# Patient Record
Sex: Female | Born: 1950 | Race: White | Hispanic: No | Marital: Married | Smoking: Former smoker
Health system: Southern US, Community
[De-identification: ages and names within clinical notes are randomized; demographics above are authoritative.]

## PROBLEM LIST (undated history)

## (undated) DIAGNOSIS — N83202 Unspecified ovarian cyst, left side: Secondary | ICD-10-CM

## (undated) DIAGNOSIS — N879 Dysplasia of cervix uteri, unspecified: Secondary | ICD-10-CM

## (undated) DIAGNOSIS — Z9189 Other specified personal risk factors, not elsewhere classified: Secondary | ICD-10-CM

## (undated) DIAGNOSIS — N893 Dysplasia of vagina, unspecified: Secondary | ICD-10-CM

## (undated) HISTORY — DX: Dysplasia of vagina, unspecified: N89.3

## (undated) HISTORY — DX: Other specified personal risk factors, not elsewhere classified: Z91.89

## (undated) HISTORY — PX: OVARIAN CYST REMOVAL: SHX89

## (undated) HISTORY — DX: Dysplasia of cervix uteri, unspecified: N87.9

## (undated) HISTORY — PX: COLPOSCOPY: SHX161

## (undated) HISTORY — PX: OOPHORECTOMY: SHX86

## (undated) HISTORY — PX: BREAST BIOPSY: SHX20

## (undated) HISTORY — DX: Unspecified ovarian cyst, left side: N83.202

---

## 1990-10-12 DIAGNOSIS — N879 Dysplasia of cervix uteri, unspecified: Secondary | ICD-10-CM

## 1990-10-12 HISTORY — PX: CERVICAL BIOPSY  W/ LOOP ELECTRODE EXCISION: SUR135

## 1990-10-12 HISTORY — DX: Dysplasia of cervix uteri, unspecified: N87.9

## 1998-10-12 HISTORY — PX: APPENDECTOMY: SHX54

## 1998-10-12 HISTORY — PX: ABDOMINAL HYSTERECTOMY: SHX81

## 1998-12-04 ENCOUNTER — Inpatient Hospital Stay (HOSPITAL_COMMUNITY): Admission: RE | Admit: 1998-12-04 | Discharge: 1998-12-07 | Payer: Self-pay | Admitting: Obstetrics and Gynecology

## 1998-12-18 ENCOUNTER — Emergency Department (HOSPITAL_COMMUNITY): Admission: EM | Admit: 1998-12-18 | Discharge: 1998-12-19 | Payer: Self-pay | Admitting: Emergency Medicine

## 1999-09-19 ENCOUNTER — Emergency Department (HOSPITAL_COMMUNITY): Admission: EM | Admit: 1999-09-19 | Discharge: 1999-09-19 | Payer: Self-pay | Admitting: *Deleted

## 2000-01-28 ENCOUNTER — Other Ambulatory Visit: Admission: RE | Admit: 2000-01-28 | Discharge: 2000-01-28 | Payer: Self-pay | Admitting: Obstetrics and Gynecology

## 2001-01-31 ENCOUNTER — Other Ambulatory Visit: Admission: RE | Admit: 2001-01-31 | Discharge: 2001-01-31 | Payer: Self-pay | Admitting: Obstetrics and Gynecology

## 2002-01-23 ENCOUNTER — Other Ambulatory Visit: Admission: RE | Admit: 2002-01-23 | Discharge: 2002-01-23 | Payer: Self-pay | Admitting: Obstetrics and Gynecology

## 2003-03-15 ENCOUNTER — Other Ambulatory Visit: Admission: RE | Admit: 2003-03-15 | Discharge: 2003-03-15 | Payer: Self-pay | Admitting: Obstetrics and Gynecology

## 2003-10-13 DIAGNOSIS — N893 Dysplasia of vagina, unspecified: Secondary | ICD-10-CM

## 2003-10-13 HISTORY — DX: Dysplasia of vagina, unspecified: N89.3

## 2004-04-02 ENCOUNTER — Other Ambulatory Visit: Admission: RE | Admit: 2004-04-02 | Discharge: 2004-04-02 | Payer: Self-pay | Admitting: Obstetrics and Gynecology

## 2004-05-13 ENCOUNTER — Other Ambulatory Visit: Admission: RE | Admit: 2004-05-13 | Discharge: 2004-05-13 | Payer: Self-pay | Admitting: Obstetrics and Gynecology

## 2004-11-06 ENCOUNTER — Other Ambulatory Visit: Admission: RE | Admit: 2004-11-06 | Discharge: 2004-11-06 | Payer: Self-pay | Admitting: Obstetrics and Gynecology

## 2005-04-30 ENCOUNTER — Other Ambulatory Visit: Admission: RE | Admit: 2005-04-30 | Discharge: 2005-04-30 | Payer: Self-pay | Admitting: Obstetrics and Gynecology

## 2006-01-18 ENCOUNTER — Other Ambulatory Visit: Admission: RE | Admit: 2006-01-18 | Discharge: 2006-01-18 | Payer: Self-pay | Admitting: Obstetrics and Gynecology

## 2006-05-26 ENCOUNTER — Other Ambulatory Visit: Admission: RE | Admit: 2006-05-26 | Discharge: 2006-05-26 | Payer: Self-pay | Admitting: Obstetrics and Gynecology

## 2007-06-06 ENCOUNTER — Other Ambulatory Visit: Admission: RE | Admit: 2007-06-06 | Discharge: 2007-06-06 | Payer: Self-pay | Admitting: Obstetrics and Gynecology

## 2008-06-06 ENCOUNTER — Other Ambulatory Visit: Admission: RE | Admit: 2008-06-06 | Discharge: 2008-06-06 | Payer: Self-pay | Admitting: Obstetrics and Gynecology

## 2008-06-26 ENCOUNTER — Ambulatory Visit: Payer: Self-pay | Admitting: Obstetrics and Gynecology

## 2008-10-08 ENCOUNTER — Ambulatory Visit: Payer: Self-pay | Admitting: Obstetrics and Gynecology

## 2008-10-12 HISTORY — PX: BREAST SURGERY: SHX581

## 2009-06-25 ENCOUNTER — Other Ambulatory Visit: Admission: RE | Admit: 2009-06-25 | Discharge: 2009-06-25 | Payer: Self-pay | Admitting: Obstetrics and Gynecology

## 2009-06-25 ENCOUNTER — Encounter: Payer: Self-pay | Admitting: Obstetrics and Gynecology

## 2009-06-25 ENCOUNTER — Ambulatory Visit: Payer: Self-pay | Admitting: Obstetrics and Gynecology

## 2010-06-10 ENCOUNTER — Encounter: Admission: RE | Admit: 2010-06-10 | Discharge: 2010-06-10 | Payer: Self-pay | Admitting: Obstetrics and Gynecology

## 2010-07-10 ENCOUNTER — Ambulatory Visit: Payer: Self-pay | Admitting: Obstetrics and Gynecology

## 2010-07-10 ENCOUNTER — Other Ambulatory Visit: Admission: RE | Admit: 2010-07-10 | Discharge: 2010-07-10 | Payer: Self-pay | Admitting: Obstetrics and Gynecology

## 2010-07-25 ENCOUNTER — Ambulatory Visit: Payer: Self-pay | Admitting: Obstetrics and Gynecology

## 2011-05-06 ENCOUNTER — Other Ambulatory Visit: Payer: Self-pay | Admitting: Obstetrics and Gynecology

## 2011-05-06 DIAGNOSIS — Z1231 Encounter for screening mammogram for malignant neoplasm of breast: Secondary | ICD-10-CM

## 2011-06-12 ENCOUNTER — Ambulatory Visit
Admission: RE | Admit: 2011-06-12 | Discharge: 2011-06-12 | Disposition: A | Discharge status: Home / Self Care | Payer: BC Managed Care – PPO | Source: Ambulatory Visit | Attending: Obstetrics and Gynecology | Admitting: Obstetrics and Gynecology

## 2011-06-12 DIAGNOSIS — Z1231 Encounter for screening mammogram for malignant neoplasm of breast: Secondary | ICD-10-CM

## 2011-06-26 DIAGNOSIS — N893 Dysplasia of vagina, unspecified: Secondary | ICD-10-CM | POA: Insufficient documentation

## 2011-07-14 ENCOUNTER — Ambulatory Visit (INDEPENDENT_AMBULATORY_CARE_PROVIDER_SITE_OTHER): Payer: BC Managed Care – PPO | Admitting: Obstetrics and Gynecology

## 2011-07-14 ENCOUNTER — Other Ambulatory Visit (HOSPITAL_COMMUNITY)
Admission: RE | Admit: 2011-07-14 | Discharge: 2011-07-14 | Disposition: A | Discharge status: Home / Self Care | Payer: BC Managed Care – PPO | Source: Ambulatory Visit | Attending: Obstetrics and Gynecology | Admitting: Obstetrics and Gynecology

## 2011-07-14 ENCOUNTER — Encounter: Payer: Self-pay | Admitting: Obstetrics and Gynecology

## 2011-07-14 VITALS — BP 120/70 | Ht 65.0 in | Wt 137.0 lb

## 2011-07-14 DIAGNOSIS — Z01419 Encounter for gynecological examination (general) (routine) without abnormal findings: Secondary | ICD-10-CM

## 2011-07-14 DIAGNOSIS — Z780 Asymptomatic menopausal state: Secondary | ICD-10-CM

## 2011-07-14 DIAGNOSIS — M810 Age-related osteoporosis without current pathological fracture: Secondary | ICD-10-CM

## 2011-07-14 DIAGNOSIS — N951 Menopausal and female climacteric states: Secondary | ICD-10-CM

## 2011-07-14 DIAGNOSIS — E7800 Pure hypercholesterolemia, unspecified: Secondary | ICD-10-CM

## 2011-07-14 MED ORDER — IBANDRONATE SODIUM 150 MG PO TABS: 150.0000 mg | ORAL_TABLET | ORAL | Status: DC

## 2011-07-14 MED ORDER — ESTROGENS CONJUGATED 0.625 MG PO TABS: 0.6250 mg | ORAL_TABLET | Freq: Every day | ORAL | Status: DC

## 2011-07-14 NOTE — Progress Notes (Signed)
The patient came to see me today for her annual GYN exam. She is doing well on her Premarin. She is on Boniva for osteoporosis with good results. She's had no fractures. She is overdue for colonoscopy.  HEENT: Within normal limits. Neck: No masses. Supraclavicular lymph nodes: Not enlarged. Breasts: Examined in both sitting and lying position. Symmetrical without skin changes or masses. Abdomen: Soft no masses guarding or rebound. No hernias. Pelvic: External within normal limits. BUS within normal limits. Vaginal examination shows good estrogen effect, no cystocele enterocele or rectocele. Cervix and uterus absent. Adnexa within normal limits. Rectovaginal confirmatory. Extremities within normal limits.   Assessment: #1. Menopausal symptoms #2. Osteoporosis  Plan: Continue Boniva. Discussed and estrogen patch but patient declined. Continue Premarin. Patient is scheduled colonoscopy.

## 2011-08-05 ENCOUNTER — Other Ambulatory Visit: Payer: Self-pay | Admitting: Obstetrics and Gynecology

## 2011-08-05 NOTE — Telephone Encounter (Signed)
rx phoned in

## 2011-08-13 ENCOUNTER — Telehealth: Payer: Self-pay

## 2011-08-13 NOTE — Telephone Encounter (Signed)
Left message and told patient what Dr. Reece Agar said in note below. Call me if any questions.

## 2011-08-13 NOTE — Telephone Encounter (Signed)
Macrocytosis means her red blood cells slightly larger than average. She was not however anemic and so I do not believe it's a sign of any disease. It would not hurt however for her run this by her PCP.

## 2011-08-13 NOTE — Telephone Encounter (Signed)
She said she got copy of her labs in mail and letter saying cholesterol okay. She was filing that recently and noticed at the bottom of the CBC w Diff that it said Flags:  Macrocytosis.  She said that was not addressed. She said she researched it on the internet and it said it can be a sign of Bone Marrow problems, liver disease, Vit B12 deficiency. She said she feels fine but wondered if this was anything significant??  Also, wanted Dr. Reece Agar to know she is having colonoscopy next Tuesday.

## 2011-08-21 ENCOUNTER — Encounter: Payer: Self-pay | Admitting: Obstetrics and Gynecology

## 2012-06-01 ENCOUNTER — Other Ambulatory Visit: Payer: Self-pay | Admitting: Obstetrics and Gynecology

## 2012-06-01 ENCOUNTER — Telehealth: Payer: Self-pay | Admitting: *Deleted

## 2012-06-01 DIAGNOSIS — Z1231 Encounter for screening mammogram for malignant neoplasm of breast: Secondary | ICD-10-CM

## 2012-06-01 NOTE — Telephone Encounter (Signed)
Pt called requesting date of last dexa 07/10/10 giving to pt and told to call and schedule anytime after this date.

## 2012-06-15 ENCOUNTER — Ambulatory Visit
Admission: RE | Admit: 2012-06-15 | Discharge: 2012-06-15 | Disposition: A | Discharge status: Home / Self Care | Payer: BC Managed Care – PPO | Source: Ambulatory Visit | Attending: Obstetrics and Gynecology | Admitting: Obstetrics and Gynecology

## 2012-06-15 DIAGNOSIS — Z1231 Encounter for screening mammogram for malignant neoplasm of breast: Secondary | ICD-10-CM

## 2012-07-13 ENCOUNTER — Other Ambulatory Visit: Payer: Self-pay | Admitting: Obstetrics and Gynecology

## 2012-07-13 DIAGNOSIS — M810 Age-related osteoporosis without current pathological fracture: Secondary | ICD-10-CM

## 2012-07-18 ENCOUNTER — Encounter: Payer: BC Managed Care – PPO | Admitting: Obstetrics and Gynecology

## 2012-07-19 ENCOUNTER — Ambulatory Visit (INDEPENDENT_AMBULATORY_CARE_PROVIDER_SITE_OTHER): Payer: BC Managed Care – PPO | Admitting: Obstetrics and Gynecology

## 2012-07-19 ENCOUNTER — Encounter: Payer: Self-pay | Admitting: Obstetrics and Gynecology

## 2012-07-19 ENCOUNTER — Ambulatory Visit (INDEPENDENT_AMBULATORY_CARE_PROVIDER_SITE_OTHER): Payer: BC Managed Care – PPO

## 2012-07-19 ENCOUNTER — Other Ambulatory Visit (HOSPITAL_COMMUNITY)
Admission: RE | Admit: 2012-07-19 | Discharge: 2012-07-19 | Disposition: A | Discharge status: Home / Self Care | Payer: BC Managed Care – PPO | Source: Ambulatory Visit | Attending: Obstetrics and Gynecology | Admitting: Obstetrics and Gynecology

## 2012-07-19 VITALS — BP 120/70 | Ht 65.0 in | Wt 132.0 lb

## 2012-07-19 DIAGNOSIS — Z01419 Encounter for gynecological examination (general) (routine) without abnormal findings: Secondary | ICD-10-CM

## 2012-07-19 DIAGNOSIS — M810 Age-related osteoporosis without current pathological fracture: Secondary | ICD-10-CM

## 2012-07-19 DIAGNOSIS — Z780 Asymptomatic menopausal state: Secondary | ICD-10-CM

## 2012-07-19 DIAGNOSIS — Z1151 Encounter for screening for human papillomavirus (HPV): Secondary | ICD-10-CM | POA: Insufficient documentation

## 2012-07-19 LAB — CBC WITH DIFFERENTIAL/PLATELET
Basophils Absolute: 0 10*3/uL (ref 0.0–0.1)
Eosinophils Relative: 1 % (ref 0–5)
HCT: 39.8 % (ref 36.0–46.0)
Lymphocytes Relative: 33 % (ref 12–46)
Lymphs Abs: 1.8 10*3/uL (ref 0.7–4.0)
MCV: 102.3 fL — ABNORMAL HIGH (ref 78.0–100.0)
Monocytes Absolute: 0.5 10*3/uL (ref 0.1–1.0)
Neutro Abs: 3.1 10*3/uL (ref 1.7–7.7)
RBC: 3.89 MIL/uL (ref 3.87–5.11)
WBC: 5.5 10*3/uL (ref 4.0–10.5)

## 2012-07-19 LAB — HEMOGLOBIN A1C: Mean Plasma Glucose: 105 mg/dL (ref <117)

## 2012-07-19 LAB — LIPID PANEL
Cholesterol: 212 mg/dL — ABNORMAL HIGH (ref 0–200)
Triglycerides: 101 mg/dL (ref <150)

## 2012-07-19 MED ORDER — ESTROGENS CONJUGATED 0.625 MG PO TABS: 0.6250 mg | ORAL_TABLET | Freq: Every day | ORAL | Status: DC

## 2012-07-19 MED ORDER — IBANDRONATE SODIUM 150 MG PO TABS: 150.0000 mg | ORAL_TABLET | ORAL | Status: DC

## 2012-07-19 NOTE — Addendum Note (Signed)
Addended by: Dayna Barker on: 07/19/2012 10:03 AM   Modules accepted: Orders

## 2012-07-19 NOTE — Patient Instructions (Signed)
Continue Premarin and Boniva.

## 2012-07-19 NOTE — Progress Notes (Signed)
Patient came to see me today for her annual GYN exam. She remains on Premarin. We had discussed previously switching her to estrogen patch but she declined. She is aware of the differences in DVT risk. She takes her Premarin 3 weeks on and one week off. During her three-week she's on she also takes breaks in  Treatment. She had her bone density today. She is on generic Boniva for her osteoporosis. Her hip showed statistically significant improvement but there was some decline in her spine. She has been on medication for 4 years. She has had no fractures. She had a TAH, BSO, appendectomy in 2000 for fibroids and a benign ovarian cyst. She had had a LEEP procedure for cervical dysplasia in 1992. Her pathology report at the time of hysterectomy showed a benign cervix. 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 Starting  in 2006 she has had normal Pap smears yearly. She has had a colonoscopy in the last year with removal of benign polyp. She had a normal mammogram this year. She has had no vaginal bleeding or pelvic pain.  HEENT: Within normal limits. Kennon Portela present. Neck: No masses. Supraclavicular lymph nodes: Not enlarged. Breasts: Examined in both sitting and lying position. Symmetrical without skin changes or masses. Abdomen: Soft no masses guarding or rebound. No hernias. Pelvic: External within normal limits. BUS within normal limits. Vaginal examination shows good estrogen effect, no cystocele enterocele or rectocele. Cervix and uterus absent. Adnexa within normal limits. Rectovaginal confirmatory. Extremities within normal limits.  Assessment: #1. Osteoporosis #2. Menopausal symptoms #3. Low grade vaginal dysplasia  Plan: Continue Premarin as above. Continue generic Boniva as above. We discussed her mixed findings on bone density. We discussed followup bone density in one year to be sure the spine loss is not a trend. The new Pap smear guidelines were discussed with the  patient. Pap and high risk HPV testing done.

## 2012-07-20 ENCOUNTER — Other Ambulatory Visit: Payer: Self-pay | Admitting: Obstetrics and Gynecology

## 2012-07-20 DIAGNOSIS — E7800 Pure hypercholesterolemia, unspecified: Secondary | ICD-10-CM

## 2012-07-20 LAB — URINALYSIS W MICROSCOPIC + REFLEX CULTURE
Bilirubin Urine: NEGATIVE
Casts: NONE SEEN
Glucose, UA: NEGATIVE mg/dL
Hgb urine dipstick: NEGATIVE
Ketones, ur: NEGATIVE mg/dL
Protein, ur: NEGATIVE mg/dL

## 2012-07-22 LAB — URINE CULTURE

## 2012-09-10 ENCOUNTER — Other Ambulatory Visit: Payer: Self-pay | Admitting: Obstetrics and Gynecology

## 2012-09-12 NOTE — Telephone Encounter (Signed)
rx called in [facility] 

## 2012-09-13 ENCOUNTER — Telehealth: Payer: Self-pay | Admitting: *Deleted

## 2012-09-13 ENCOUNTER — Other Ambulatory Visit: Payer: Self-pay | Admitting: Obstetrics and Gynecology

## 2012-09-13 NOTE — Telephone Encounter (Signed)
Pt bonvia never was received at the pharmacy rx called in to [facility]. Left on pt voicemail this has been done.

## 2013-05-25 ENCOUNTER — Telehealth: Payer: Self-pay | Admitting: *Deleted

## 2013-05-25 NOTE — Telephone Encounter (Signed)
Pt called left message in voicemail if 3D mammogram should be done. I called pt back and left message this decision if total up to pt if she would like to have done. Pt is aware of the extra cost for this 3D mammogram.

## 2013-05-26 ENCOUNTER — Other Ambulatory Visit: Payer: Self-pay

## 2013-05-26 DIAGNOSIS — Z1231 Encounter for screening mammogram for malignant neoplasm of breast: Secondary | ICD-10-CM

## 2013-06-14 ENCOUNTER — Other Ambulatory Visit: Payer: Self-pay | Admitting: Gynecology

## 2013-06-14 DIAGNOSIS — M810 Age-related osteoporosis without current pathological fracture: Secondary | ICD-10-CM

## 2013-06-16 ENCOUNTER — Ambulatory Visit
Admission: RE | Admit: 2013-06-16 | Discharge: 2013-06-16 | Disposition: A | Discharge status: Home / Self Care | Payer: BC Managed Care – PPO | Source: Ambulatory Visit

## 2013-06-16 DIAGNOSIS — Z1231 Encounter for screening mammogram for malignant neoplasm of breast: Secondary | ICD-10-CM

## 2013-06-19 ENCOUNTER — Other Ambulatory Visit: Payer: Self-pay | Admitting: Gynecology

## 2013-06-19 DIAGNOSIS — R928 Other abnormal and inconclusive findings on diagnostic imaging of breast: Secondary | ICD-10-CM

## 2013-06-21 ENCOUNTER — Ambulatory Visit
Admission: RE | Admit: 2013-06-21 | Discharge: 2013-06-21 | Disposition: A | Discharge status: Home / Self Care | Payer: BC Managed Care – PPO | Source: Ambulatory Visit | Attending: Gynecology | Admitting: Gynecology

## 2013-06-21 DIAGNOSIS — R928 Other abnormal and inconclusive findings on diagnostic imaging of breast: Secondary | ICD-10-CM

## 2013-06-27 ENCOUNTER — Other Ambulatory Visit: Payer: BC Managed Care – PPO

## 2013-07-27 ENCOUNTER — Encounter: Payer: Self-pay | Admitting: Gynecology

## 2013-08-03 ENCOUNTER — Ambulatory Visit (INDEPENDENT_AMBULATORY_CARE_PROVIDER_SITE_OTHER): Payer: BC Managed Care – PPO

## 2013-08-03 ENCOUNTER — Ambulatory Visit (INDEPENDENT_AMBULATORY_CARE_PROVIDER_SITE_OTHER): Payer: BC Managed Care – PPO | Admitting: Gynecology

## 2013-08-03 ENCOUNTER — Encounter: Payer: Self-pay | Admitting: Gynecology

## 2013-08-03 VITALS — BP 124/78 | Ht 65.0 in | Wt 140.0 lb

## 2013-08-03 DIAGNOSIS — M810 Age-related osteoporosis without current pathological fracture: Secondary | ICD-10-CM

## 2013-08-03 DIAGNOSIS — N893 Dysplasia of vagina, unspecified: Secondary | ICD-10-CM

## 2013-08-03 DIAGNOSIS — Z01419 Encounter for gynecological examination (general) (routine) without abnormal findings: Secondary | ICD-10-CM

## 2013-08-03 DIAGNOSIS — N951 Menopausal and female climacteric states: Secondary | ICD-10-CM

## 2013-08-03 DIAGNOSIS — N898 Other specified noninflammatory disorders of vagina: Secondary | ICD-10-CM

## 2013-08-03 DIAGNOSIS — Z780 Asymptomatic menopausal state: Secondary | ICD-10-CM

## 2013-08-03 LAB — LIPID PANEL
Cholesterol: 210 mg/dL — ABNORMAL HIGH (ref 0–200)
HDL: 83 mg/dL (ref >39)
LDL Cholesterol: 113 mg/dL — ABNORMAL HIGH (ref 0–99)
Triglycerides: 69 mg/dL (ref <150)
VLDL: 14 mg/dL (ref 0–40)

## 2013-08-03 LAB — CBC WITH DIFFERENTIAL/PLATELET
Basophils Relative: 0 % (ref 0–1)
Eosinophils Absolute: 0.1 10*3/uL (ref 0.0–0.7)
HCT: 38.6 % (ref 36.0–46.0)
Hemoglobin: 13.4 g/dL (ref 12.0–15.0)
MCH: 33.9 pg (ref 26.0–34.0)
MCHC: 34.7 g/dL (ref 30.0–36.0)
MCV: 97.7 fL (ref 78.0–100.0)
Monocytes Absolute: 0.4 10*3/uL (ref 0.1–1.0)
Monocytes Relative: 7 % (ref 3–12)

## 2013-08-03 LAB — COMPREHENSIVE METABOLIC PANEL
Albumin: 4.1 g/dL (ref 3.5–5.2)
Alkaline Phosphatase: 59 U/L (ref 39–117)
BUN: 16 mg/dL (ref 6–23)
CO2: 29 mEq/L (ref 19–32)
Glucose, Bld: 91 mg/dL (ref 70–99)
Total Bilirubin: 0.5 mg/dL (ref 0.3–1.2)

## 2013-08-03 MED ORDER — ESTROGENS CONJUGATED 0.625 MG PO TABS: 0.6250 mg | ORAL_TABLET | Freq: Every day | ORAL | Status: DC

## 2013-08-03 NOTE — Progress Notes (Signed)
Wendy Rangel Jun 22, 1951 [id number]        62 y.o.  G1P0010 for annual exam.  Former patient Dr. Eda Paschal. Several issues noted below.  Past medical history,surgical history, medications, allergies, family history and social history were all reviewed and documented in the EPIC chart.  ROS:  Performed and pertinent positives and negatives are included in the history, assessment and plan .  Exam: Kim assistant Filed Vitals:   08/03/13 1012  BP: 124/78  Height: 5\' 5"  (1.651 m)  Weight: 140 lb (63.504 kg)   General appearance  Normal Skin grossly normal Head/Neck normal with no cervical or supraclavicular adenopathy thyroid normal Lungs  clear Cardiac RR, without RMG Abdominal  soft, nontender, without masses, organomegaly or hernia Breasts  examined lying and sitting without masses, retractions, discharge or axillary adenopathy. Pelvic  Ext/BUS/vagina  normal with mild atrophic changes  Adnexa  Without masses or tenderness    Anus and perineum  normal   Rectovaginal  normal sphincter tone without palpated masses or tenderness.    Assessment/Plan:  62 y.o. G28P0010 female for annual exam.   1. 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 On ERT Premarin 0.625 although admits to not taking it regularly.  I reviewed the whole issue of HRT with her to include the WHI study with increased risk of stroke, heart attack, DVT and breast cancer. The ACOG and NAMS statements for lowest dose for the shortest period of time reviewed. Transdermal versus oral first-pass effect benefit discussed.  As she's not taking it regularly the options to wean and stop now discussed. Patient is not interested in doing that at this point and wants to continue. She does note when she goes without it she does have hot flashes and that's why she restarts it. She understands the risks and accepts them. I refilled her x1 year. 2. Osteoporosis. Patient had DEXA today at 1 year short interval followup with T score  -2.5 left femoral neck. Spine with statistically significant improvement. Right and left hips stable. Patient has been on Boniva for 5 years. Options for drug-free holiday now versus continuing for 2 more years with repeat DEXA discussed. Patient strongly wants a free holiday and we will plan on stopping her Boniva and repeating her DEXA in 2 years. Increase calcium vitamin D reviewed. Check vitamin D level today. 3. Mammography 06/2013. Continue with annual mammography. SBE monthly reviewed. 4. Pap smear 2013. No Pap smear done today. History of VAIN 1 2005 with normal Pap smears since then to include Pap/HPV negative last year. Options to stop screening altogether versus less frequent screening intervals reviewed. She is status post hysterectomy for benign indications. We'll plan repeat Pap smear in 3 year interval. 5. Colonoscopy 2012. Repeat at their recommended interval. 6. Health maintenance. Baseline CBC comprehensive metabolic panel lipid profile urinalysis TSH vitamin D ordered. Followup one year, sooner as needed.    Note: This document was prepared with digital dictation and possible smart phrase technology. Any transcriptional errors that result from this process are unintentional.   Dara Lords MD, 10:50 AM 08/03/2013

## 2013-08-03 NOTE — Patient Instructions (Signed)
Followup in one year for annual exam, sooner as needed. 

## 2013-08-04 LAB — URINALYSIS W MICROSCOPIC + REFLEX CULTURE
Bacteria, UA: NONE SEEN
Bilirubin Urine: NEGATIVE
Crystals: NONE SEEN
Glucose, UA: NEGATIVE mg/dL
Ketones, ur: NEGATIVE mg/dL
Specific Gravity, Urine: 1.021 (ref 1.005–1.030)
Squamous Epithelial / LPF: NONE SEEN
Urobilinogen, UA: 0.2 mg/dL (ref 0.0–1.0)

## 2013-08-17 ENCOUNTER — Other Ambulatory Visit: Payer: Self-pay

## 2014-05-18 ENCOUNTER — Other Ambulatory Visit: Payer: Self-pay

## 2014-05-18 DIAGNOSIS — Z1231 Encounter for screening mammogram for malignant neoplasm of breast: Secondary | ICD-10-CM

## 2014-06-22 ENCOUNTER — Ambulatory Visit: Payer: BC Managed Care – PPO

## 2014-06-29 ENCOUNTER — Ambulatory Visit
Admission: RE | Admit: 2014-06-29 | Discharge: 2014-06-29 | Disposition: A | Discharge status: Home / Self Care | Payer: BC Managed Care – PPO | Source: Ambulatory Visit

## 2014-06-29 DIAGNOSIS — Z1231 Encounter for screening mammogram for malignant neoplasm of breast: Secondary | ICD-10-CM

## 2014-08-09 ENCOUNTER — Encounter: Payer: Self-pay | Admitting: Gynecology

## 2014-08-09 ENCOUNTER — Ambulatory Visit (INDEPENDENT_AMBULATORY_CARE_PROVIDER_SITE_OTHER): Payer: BC Managed Care – PPO | Admitting: Gynecology

## 2014-08-09 VITALS — BP 120/70 | Ht 65.0 in | Wt 143.0 lb

## 2014-08-09 DIAGNOSIS — M810 Age-related osteoporosis without current pathological fracture: Secondary | ICD-10-CM

## 2014-08-09 DIAGNOSIS — Z01419 Encounter for gynecological examination (general) (routine) without abnormal findings: Secondary | ICD-10-CM

## 2014-08-09 DIAGNOSIS — Z79890 Hormone replacement therapy (postmenopausal): Secondary | ICD-10-CM

## 2014-08-09 DIAGNOSIS — Z780 Asymptomatic menopausal state: Secondary | ICD-10-CM

## 2014-08-09 LAB — CBC WITH DIFFERENTIAL/PLATELET
Basophils Absolute: 0 10*3/uL (ref 0.0–0.1)
Basophils Relative: 0 % (ref 0–1)
Eosinophils Absolute: 0.1 10*3/uL (ref 0.0–0.7)
Eosinophils Relative: 2 % (ref 0–5)
HEMATOCRIT: 38.5 % (ref 36.0–46.0)
HEMOGLOBIN: 13.3 g/dL (ref 12.0–15.0)
LYMPHS PCT: 36 % (ref 12–46)
Lymphs Abs: 1.9 10*3/uL (ref 0.7–4.0)
MCH: 34 pg (ref 26.0–34.0)
MCHC: 34.5 g/dL (ref 30.0–36.0)
MCV: 98.5 fL (ref 78.0–100.0)
MONO ABS: 0.4 10*3/uL (ref 0.1–1.0)
MONOS PCT: 8 % (ref 3–12)
NEUTROS ABS: 2.8 10*3/uL (ref 1.7–7.7)
Neutrophils Relative %: 54 % (ref 43–77)
Platelets: 329 10*3/uL (ref 150–400)
RBC: 3.91 MIL/uL (ref 3.87–5.11)
RDW: 13 % (ref 11.5–15.5)
WBC: 5.2 10*3/uL (ref 4.0–10.5)

## 2014-08-09 LAB — COMPREHENSIVE METABOLIC PANEL
ALBUMIN: 3.9 g/dL (ref 3.5–5.2)
ALT: 13 U/L (ref 0–35)
AST: 20 U/L (ref 0–37)
Alkaline Phosphatase: 65 U/L (ref 39–117)
BUN: 17 mg/dL (ref 6–23)
CALCIUM: 8.7 mg/dL (ref 8.4–10.5)
CHLORIDE: 103 meq/L (ref 96–112)
CO2: 29 mEq/L (ref 19–32)
Creat: 0.65 mg/dL (ref 0.50–1.10)
GLUCOSE: 87 mg/dL (ref 70–99)
POTASSIUM: 4.1 meq/L (ref 3.5–5.3)
Sodium: 136 mEq/L (ref 135–145)
Total Bilirubin: 1 mg/dL (ref 0.2–1.2)
Total Protein: 6.7 g/dL (ref 6.0–8.3)

## 2014-08-09 LAB — LIPID PANEL
Cholesterol: 209 mg/dL — ABNORMAL HIGH (ref 0–200)
HDL: 70 mg/dL (ref >39)
LDL Cholesterol: 118 mg/dL — ABNORMAL HIGH (ref 0–99)
TRIGLYCERIDES: 106 mg/dL (ref <150)
Total CHOL/HDL Ratio: 3 Ratio
VLDL: 21 mg/dL (ref 0–40)

## 2014-08-09 LAB — TSH: TSH: 3.033 u[IU]/mL (ref 0.350–4.500)

## 2014-08-09 MED ORDER — ESTROGENS CONJUGATED 0.625 MG PO TABS: 0.6250 mg | ORAL_TABLET | Freq: Every day | ORAL | Status: DC

## 2014-08-09 NOTE — Patient Instructions (Signed)
You may obtain a copy of any labs that were done today by logging onto MyChart as outlined in the instructions provided with your AVS (after visit summary). The office will not call with normal lab results but certainly if there are any significant abnormalities then we will contact you.   Health Maintenance, Female A healthy lifestyle and preventative care can promote health and wellness.  Maintain regular health, dental, and eye exams.  Eat a healthy diet. Foods like vegetables, fruits, whole grains, low-fat dairy products, and lean protein foods contain the nutrients you need without too many calories. Decrease your intake of foods high in solid fats, added sugars, and salt. Get information about a proper diet from your caregiver, if necessary.  Regular physical exercise is one of the most important things you can do for your health. Most adults should get at least 150 minutes of moderate-intensity exercise (any activity that increases your heart rate and causes you to sweat) each week. In addition, most adults need muscle-strengthening exercises on 2 or more days a week.   Maintain a healthy weight. The body mass index (BMI) is a screening tool to identify possible weight problems. It provides an estimate of body fat based on height and weight. Your caregiver can help determine your BMI, and can help you achieve or maintain a healthy weight. For adults 20 years and older:  A BMI below 18.5 is considered underweight.  A BMI of 18.5 to 24.9 is normal.  A BMI of 25 to 29.9 is considered overweight.  A BMI of 30 and above is considered obese.  Maintain normal blood lipids and cholesterol by exercising and minimizing your intake of saturated fat. Eat a balanced diet with plenty of fruits and vegetables. Blood tests for lipids and cholesterol should begin at age 61 and be repeated every 5 years. If your lipid or cholesterol levels are high, you are over 50, or you are a high risk for heart  disease, you may need your cholesterol levels checked more frequently.Ongoing high lipid and cholesterol levels should be treated with medicines if diet and exercise are not effective.  If you smoke, find out from your caregiver how to quit. If you do not use tobacco, do not start.  Lung cancer screening is recommended for adults aged 33 80 years who are at high risk for developing lung cancer because of a history of smoking. Yearly low-dose computed tomography (CT) is recommended for people who have at least a 30-pack-year history of smoking and are a current smoker or have quit within the past 15 years. A pack year of smoking is smoking an average of 1 pack of cigarettes a day for 1 year (for example: 1 pack a day for 30 years or 2 packs a day for 15 years). Yearly screening should continue until the smoker has stopped smoking for at least 15 years. Yearly screening should also be stopped for people who develop a health problem that would prevent them from having lung cancer treatment.  If you are pregnant, do not drink alcohol. If you are breastfeeding, be very cautious about drinking alcohol. If you are not pregnant and choose to drink alcohol, do not exceed 1 drink per day. One drink is considered to be 12 ounces (355 mL) of beer, 5 ounces (148 mL) of wine, or 1.5 ounces (44 mL) of liquor.  Avoid use of street drugs. Do not share needles with anyone. Ask for help if you need support or instructions about stopping  the use of drugs.  High blood pressure causes heart disease and increases the risk of stroke. Blood pressure should be checked at least every 1 to 2 years. Ongoing high blood pressure should be treated with medicines, if weight loss and exercise are not effective.  If you are 59 to 62 years old, ask your caregiver if you should take aspirin to prevent strokes.  Diabetes screening involves taking a blood sample to check your fasting blood sugar level. This should be done once every 3  years, after age 91, if you are within normal weight and without risk factors for diabetes. Testing should be considered at a younger age or be carried out more frequently if you are overweight and have at least 1 risk factor for diabetes.  Breast cancer screening is essential preventative care for women. You should practice "breast self-awareness." This means understanding the normal appearance and feel of your breasts and may include breast self-examination. Any changes detected, no matter how small, should be reported to a caregiver. Women in their 66s and 30s should have a clinical breast exam (CBE) by a caregiver as part of a regular health exam every 1 to 3 years. After age 101, women should have a CBE every year. Starting at age 100, women should consider having a mammogram (breast X-ray) every year. Women who have a family history of breast cancer should talk to their caregiver about genetic screening. Women at a high risk of breast cancer should talk to their caregiver about having an MRI and a mammogram every year.  Breast cancer gene (BRCA)-related cancer risk assessment is recommended for women who have family members with BRCA-related cancers. BRCA-related cancers include breast, ovarian, tubal, and peritoneal cancers. Having family members with these cancers may be associated with an increased risk for harmful changes (mutations) in the breast cancer genes BRCA1 and BRCA2. Results of the assessment will determine the need for genetic counseling and BRCA1 and BRCA2 testing.  The Pap test is a screening test for cervical cancer. Women should have a Pap test starting at age 57. Between ages 25 and 35, Pap tests should be repeated every 2 years. Beginning at age 37, you should have a Pap test every 3 years as long as the past 3 Pap tests have been normal. If you had a hysterectomy for a problem that was not cancer or a condition that could lead to cancer, then you no longer need Pap tests. If you are  between ages 50 and 76, and you have had normal Pap tests going back 10 years, you no longer need Pap tests. If you have had past treatment for cervical cancer or a condition that could lead to cancer, you need Pap tests and screening for cancer for at least 20 years after your treatment. If Pap tests have been discontinued, risk factors (such as a new sexual partner) need to be reassessed to determine if screening should be resumed. Some women have medical problems that increase the chance of getting cervical cancer. In these cases, your caregiver may recommend more frequent screening and Pap tests.  The human papillomavirus (HPV) test is an additional test that may be used for cervical cancer screening. The HPV test looks for the virus that can cause the cell changes on the cervix. The cells collected during the Pap test can be tested for HPV. The HPV test could be used to screen women aged 44 years and older, and should be used in women of any age  who have unclear Pap test results. After the age of 55, women should have HPV testing at the same frequency as a Pap test.  Colorectal cancer can be detected and often prevented. Most routine colorectal cancer screening begins at the age of 44 and continues through age 20. However, your caregiver may recommend screening at an earlier age if you have risk factors for colon cancer. On a yearly basis, your caregiver may provide home test kits to check for hidden blood in the stool. Use of a small camera at the end of a tube, to directly examine the colon (sigmoidoscopy or colonoscopy), can detect the earliest forms of colorectal cancer. Talk to your caregiver about this at age 86, when routine screening begins. Direct examination of the colon should be repeated every 5 to 10 years through age 13, unless early forms of pre-cancerous polyps or small growths are found.  Hepatitis C blood testing is recommended for all people born from [birth year] through 1964 and any  individual with known risks for hepatitis C.  Practice safe sex. Use condoms and avoid high-risk sexual practices to reduce the spread of sexually transmitted infections (STIs). Sexually active women aged 36 and younger should be checked for Chlamydia, which is a common sexually transmitted infection. Older women with new or multiple partners should also be tested for Chlamydia. Testing for other STIs is recommended if you are sexually active and at increased risk.  Osteoporosis is a disease in which the bones lose minerals and strength with aging. This can result in serious bone fractures. The risk of osteoporosis can be identified using a bone density scan. Women ages 20 and over and women at risk for fractures or osteoporosis should discuss screening with their caregivers. Ask your caregiver whether you should be taking a calcium supplement or vitamin D to reduce the rate of osteoporosis.  Menopause can be associated with physical symptoms and risks. Hormone replacement therapy is available to decrease symptoms and risks. You should talk to your caregiver about whether hormone replacement therapy is right for you.  Use sunscreen. Apply sunscreen liberally and repeatedly throughout the day. You should seek shade when your shadow is shorter than you. Protect yourself by wearing long sleeves, pants, a wide-brimmed hat, and sunglasses year round, whenever you are outdoors.  Notify your caregiver of new moles or changes in moles, especially if there is a change in shape or color. Also notify your caregiver if a mole is larger than the size of a pencil eraser.  Stay current with your immunizations. Document Released: 04/13/2011 Document Revised: 01/23/2013 Document Reviewed: 04/13/2011 [facility] Patient Information 2014 [employer].

## 2014-08-09 NOTE — Progress Notes (Signed)
Wendy Rangel March 30, 1951 [id number]        63 y.o.  G1P0010 for annual exam.  Several issues noted below.  Past medical history,surgical history, problem list, medications, allergies, family history and social history were all reviewed and documented as reviewed in the EPIC chart.  ROS:  12 system ROS performed with pertinent positives and negatives included in the history, assessment and plan.   Additional significant findings :  none   Exam: [name] Filed Vitals:   08/09/14 0838  BP: 120/70  Height: 5\' 5"  (1.651 m)  Weight: 143 lb (64.864 kg)   General appearance:  Normal affect, orientation and appearance. Skin: Grossly normal HEENT: Without gross lesions.  No cervical or supraclavicular adenopathy. Thyroid normal.  Lungs:  Clear without wheezing, rales or rhonchi Cardiac: RR, without RMG Abdominal:  Soft, nontender, without masses, guarding, rebound, organomegaly or hernia Breasts:  Examined lying and sitting without masses, retractions, discharge or axillary adenopathy. Pelvic:  Ext/BUS/vagina normal with mild atrophic changes  Adnexa  Without masses or tenderness    Anus and perineum  Normal   Rectovaginal  Normal sphincter tone without palpated masses or tenderness.    Assessment/Plan:  63 y.o. G1P0010 female for annual exam.   1. Postmenopausal/HRT. 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  Patient continues on Premarin 0.625 using it several times weekly. Findings unacceptable hot flashes if she tries to stop altogether.  I again reviewed the whole issue of HRT with her to include the WHI study with increased risk of stroke, heart attack, DVT and breast cancer. The ACOG and NAMS statements for lowest dose for the shortest period of time reviewed. Patient understands and accepts the risks and wants to continue and I refilled her 1 year 2. History of VAIN 1 2005 with normal Pap smears since.  Pap/HPV negative 2013. No Pap smear done today.  Plan repeat  Pap smear at 3-5 year interval. 3. Osteoporosis. DEXA 2014 T score -2.7 right femoral neck. Follow up from 2013 DEXA spine and hips stable. Use of Boniva for 5 years discontinued last year.  Plan repeat DEXA next year at two-year interval. Check vitamin D level today. Increased calcium vitamin D discussed. 4. Mammography 06/2014. Continue with annual mammography. SBE monthly reviewed. 5. Colonoscopy 3 years ago. Repeat at their recommended interval. 6. Health maintenance. Baseline CBC comprehensive metabolic panel lipid profile TSH vitamin D urinalysis ordered. Follow up in one year, sooner as needed.     Wendy Rangel P MD, 8:57 AM 08/09/2014

## 2014-08-10 LAB — URINALYSIS W MICROSCOPIC + REFLEX CULTURE
Bilirubin Urine: NEGATIVE
CASTS: NONE SEEN
CRYSTALS: NONE SEEN
Glucose, UA: NEGATIVE mg/dL
Hgb urine dipstick: NEGATIVE
KETONES UR: NEGATIVE mg/dL
Leukocytes, UA: NEGATIVE
NITRITE: NEGATIVE
PH: 7 (ref 5.0–8.0)
Protein, ur: NEGATIVE mg/dL
SPECIFIC GRAVITY, URINE: 1.022 (ref 1.005–1.030)
Urobilinogen, UA: 0.2 mg/dL (ref 0.0–1.0)

## 2014-08-10 LAB — VITAMIN D 25 HYDROXY (VIT D DEFICIENCY, FRACTURES): VIT D 25 HYDROXY: 33 ng/mL (ref 30–89)

## 2014-08-13 ENCOUNTER — Encounter: Payer: Self-pay | Admitting: Gynecology

## 2014-08-13 ENCOUNTER — Other Ambulatory Visit: Payer: Self-pay | Admitting: Gynecology

## 2014-08-13 LAB — URINE CULTURE: Colony Count: >=100000

## 2014-08-13 MED ORDER — AMPICILLIN 500 MG PO CAPS: 500.0000 mg | ORAL_CAPSULE | Freq: Four times a day (QID) | ORAL | Status: DC

## 2015-05-29 ENCOUNTER — Other Ambulatory Visit: Payer: Self-pay

## 2015-05-29 DIAGNOSIS — Z1231 Encounter for screening mammogram for malignant neoplasm of breast: Secondary | ICD-10-CM

## 2015-07-03 ENCOUNTER — Ambulatory Visit
Admission: RE | Admit: 2015-07-03 | Discharge: 2015-07-03 | Disposition: A | Discharge status: Home / Self Care | Payer: BC Managed Care – PPO | Source: Ambulatory Visit

## 2015-07-03 DIAGNOSIS — Z1231 Encounter for screening mammogram for malignant neoplasm of breast: Secondary | ICD-10-CM

## 2015-07-09 ENCOUNTER — Other Ambulatory Visit: Payer: Self-pay | Admitting: Gynecology

## 2015-07-09 DIAGNOSIS — M810 Age-related osteoporosis without current pathological fracture: Secondary | ICD-10-CM

## 2015-08-13 ENCOUNTER — Encounter: Payer: Self-pay | Admitting: Gynecology

## 2015-08-13 ENCOUNTER — Ambulatory Visit (INDEPENDENT_AMBULATORY_CARE_PROVIDER_SITE_OTHER): Payer: BC Managed Care – PPO

## 2015-08-13 ENCOUNTER — Other Ambulatory Visit (HOSPITAL_COMMUNITY)
Admission: RE | Admit: 2015-08-13 | Discharge: 2015-08-13 | Disposition: A | Discharge status: Home / Self Care | Payer: BC Managed Care – PPO | Source: Ambulatory Visit | Attending: Gynecology | Admitting: Gynecology

## 2015-08-13 ENCOUNTER — Ambulatory Visit (INDEPENDENT_AMBULATORY_CARE_PROVIDER_SITE_OTHER): Payer: BC Managed Care – PPO | Admitting: Gynecology

## 2015-08-13 VITALS — BP 124/76 | Ht 66.0 in | Wt 146.0 lb

## 2015-08-13 DIAGNOSIS — Z79890 Hormone replacement therapy (postmenopausal): Secondary | ICD-10-CM

## 2015-08-13 DIAGNOSIS — M810 Age-related osteoporosis without current pathological fracture: Secondary | ICD-10-CM | POA: Diagnosis not present

## 2015-08-13 DIAGNOSIS — Z01419 Encounter for gynecological examination (general) (routine) without abnormal findings: Secondary | ICD-10-CM | POA: Diagnosis present

## 2015-08-13 DIAGNOSIS — Z780 Asymptomatic menopausal state: Secondary | ICD-10-CM | POA: Diagnosis not present

## 2015-08-13 LAB — LIPID PANEL
CHOLESTEROL: 230 mg/dL — AB (ref 125–200)
HDL: 78 mg/dL (ref >=46)
LDL Cholesterol: 130 mg/dL — ABNORMAL HIGH (ref <130)
Total CHOL/HDL Ratio: 2.9 Ratio (ref <=5.0)
Triglycerides: 109 mg/dL (ref <150)
VLDL: 22 mg/dL (ref <30)

## 2015-08-13 LAB — TSH: TSH: 2.809 u[IU]/mL (ref 0.350–4.500)

## 2015-08-13 LAB — CBC WITH DIFFERENTIAL/PLATELET
Basophils Absolute: 0 10*3/uL (ref 0.0–0.1)
Basophils Relative: 0 % (ref 0–1)
Eosinophils Absolute: 0.1 10*3/uL (ref 0.0–0.7)
Eosinophils Relative: 1 % (ref 0–5)
HEMATOCRIT: 39.2 % (ref 36.0–46.0)
HEMOGLOBIN: 13.8 g/dL (ref 12.0–15.0)
LYMPHS PCT: 36 % (ref 12–46)
Lymphs Abs: 2.6 10*3/uL (ref 0.7–4.0)
MCH: 34.4 pg — AB (ref 26.0–34.0)
MCHC: 35.2 g/dL (ref 30.0–36.0)
MCV: 97.8 fL (ref 78.0–100.0)
MONO ABS: 0.6 10*3/uL (ref 0.1–1.0)
MONOS PCT: 8 % (ref 3–12)
MPV: 9.2 fL (ref 8.6–12.4)
NEUTROS ABS: 3.9 10*3/uL (ref 1.7–7.7)
NEUTROS PCT: 55 % (ref 43–77)
PLATELETS: 324 10*3/uL (ref 150–400)
RBC: 4.01 MIL/uL (ref 3.87–5.11)
RDW: 12.9 % (ref 11.5–15.5)
WBC: 7.1 10*3/uL (ref 4.0–10.5)

## 2015-08-13 LAB — COMPREHENSIVE METABOLIC PANEL
ALK PHOS: 70 U/L (ref 33–130)
ALT: 17 U/L (ref 6–29)
AST: 24 U/L (ref 10–35)
Albumin: 4 g/dL (ref 3.6–5.1)
BUN: 15 mg/dL (ref 7–25)
CO2: 28 mmol/L (ref 20–31)
CREATININE: 0.53 mg/dL (ref 0.50–0.99)
Calcium: 9.1 mg/dL (ref 8.6–10.4)
Chloride: 102 mmol/L (ref 98–110)
Glucose, Bld: 87 mg/dL (ref 65–99)
Potassium: 4.6 mmol/L (ref 3.5–5.3)
SODIUM: 138 mmol/L (ref 135–146)
TOTAL PROTEIN: 7.2 g/dL (ref 6.1–8.1)
Total Bilirubin: 0.7 mg/dL (ref 0.2–1.2)

## 2015-08-13 MED ORDER — ESTROGENS CONJUGATED 0.625 MG PO TABS: 0.6250 mg | ORAL_TABLET | Freq: Every day | ORAL | Status: DC

## 2015-08-13 NOTE — Addendum Note (Signed)
Addended by: Dayna BarkerGARDNER, Shi Blankenship K on: 08/13/2015 03:06 PM   Modules accepted: Orders

## 2015-08-13 NOTE — Patient Instructions (Signed)

## 2015-08-13 NOTE — Progress Notes (Signed)
Wendy Rangel Jul 05, 1951 [id number]        64 y.o.  G1P0010  No LMP recorded. Patient has had a hysterectomy. for annual exam.  Several issues noted below.  Past medical history,surgical history, problem list, medications, allergies, family history and social history were all reviewed and documented as reviewed in the EPIC chart.  ROS:  Performed with pertinent positives and negatives included in the history, assessment and plan.   Additional significant findings :  none   Exam: [name] Filed Vitals:   08/13/15 1323  BP: 124/76  Height: 5\' 6"  (1.676 m)  Weight: 146 lb (66.225 kg)   General appearance:  Normal affect, orientation and appearance. Skin: Grossly normal HEENT: Without gross lesions.  No cervical or supraclavicular adenopathy. Thyroid normal.  Lungs:  Clear without wheezing, rales or rhonchi Cardiac: RR, without RMG Abdominal:  Soft, nontender, without masses, guarding, rebound, organomegaly or hernia Breasts:  Examined lying and sitting without masses, retractions, discharge or axillary adenopathy. Pelvic:  Ext/BUS/vagina with atrophic changes  Adnexa  Without masses or tenderness    Anus and perineum  Normal   Rectovaginal  Normal sphincter tone without palpated masses or tenderness.    Assessment/Plan:  64 y.o. G1P0010 female for annual exam.   1. Postmenopausal/HRT. 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 Is on Premarin 0.625 and takes it several times weekly. Feels that it does help her but does not take it every day. I reviewed the whole issue of HRT in the issues of lowest dose for shortest period of time. Recommendations to wean discussed. Increased risk of stroke heart attack DVT and breast cancer all reviewed. At this point patient wants to continue feeling that she is receiving benefit from this, understanding the risks and accepting them. Refill 1 year provided. 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 Patient was told by a friend after her mother's  death that her mother did take DES during her pregnancy. Was subsequently told that this probably was why the patient never achieved a pregnancy. We'll go ahead and plan annual cytology based on this history. She does have a history of VAIN 1 In 2005 with normal Pap smears since then. Last Pap smear/HPV 2013 negative. Pap smear of vaginal cuff done today. 3. Osteoporosis. Just had her bone density today which shows T score -2.7, overall stable from prior DEXA and her baseline. Had been on Boniva for 5 years discontinued 2 years ago. We'll plan expectant management with follow up DEXA in 2 years. Check vitamin D level today. 4. Mammography 06/2015. Continue with annual mammography. SBE monthly reviewed. 5. Colonoscopy 2012. Repeat at their recommended interval. 6. Health maintenance. Patient requests baseline lab work. CBC, comprehensive metabolic panel, lipid profile, TSH, vitamin D, urinalysis ordered. Follow up in one year, sooner as needed.   Wendy Rangel P MD, 2:07 PM 08/13/2015

## 2015-08-14 LAB — URINALYSIS W MICROSCOPIC + REFLEX CULTURE
BACTERIA UA: NONE SEEN [HPF]
BILIRUBIN URINE: NEGATIVE
Casts: NONE SEEN [LPF]
Crystals: NONE SEEN [HPF]
GLUCOSE, UA: NEGATIVE
HGB URINE DIPSTICK: NEGATIVE
Ketones, ur: NEGATIVE
LEUKOCYTES UA: NEGATIVE
Nitrite: NEGATIVE
PROTEIN: NEGATIVE
RBC / HPF: NONE SEEN RBC/HPF (ref <=2)
SQUAMOUS EPITHELIAL / LPF: NONE SEEN [HPF] (ref <=5)
Specific Gravity, Urine: 1.022 (ref 1.001–1.035)
WBC UA: NONE SEEN WBC/HPF (ref <=5)
Yeast: NONE SEEN [HPF]
pH: 7.5 (ref 5.0–8.0)

## 2015-08-14 LAB — VITAMIN D 25 HYDROXY (VIT D DEFICIENCY, FRACTURES): VIT D 25 HYDROXY: 28 ng/mL — AB (ref 30–100)

## 2015-08-16 LAB — CYTOLOGY - PAP

## 2016-06-01 ENCOUNTER — Other Ambulatory Visit: Payer: Self-pay | Admitting: Gynecology

## 2016-06-01 DIAGNOSIS — Z1231 Encounter for screening mammogram for malignant neoplasm of breast: Secondary | ICD-10-CM

## 2016-07-06 ENCOUNTER — Ambulatory Visit
Admission: RE | Admit: 2016-07-06 | Discharge: 2016-07-06 | Disposition: A | Discharge status: Home / Self Care | Payer: BC Managed Care – PPO | Source: Ambulatory Visit | Attending: Gynecology | Admitting: Gynecology

## 2016-07-06 DIAGNOSIS — Z1231 Encounter for screening mammogram for malignant neoplasm of breast: Secondary | ICD-10-CM

## 2016-08-13 ENCOUNTER — Ambulatory Visit (INDEPENDENT_AMBULATORY_CARE_PROVIDER_SITE_OTHER): Payer: BC Managed Care – PPO | Admitting: Gynecology

## 2016-08-13 ENCOUNTER — Encounter: Payer: Self-pay | Admitting: Gynecology

## 2016-08-13 VITALS — BP 124/78 | Ht 66.0 in | Wt 146.0 lb

## 2016-08-13 DIAGNOSIS — Z13220 Encounter for screening for lipoid disorders: Secondary | ICD-10-CM | POA: Diagnosis not present

## 2016-08-13 DIAGNOSIS — Z01419 Encounter for gynecological examination (general) (routine) without abnormal findings: Secondary | ICD-10-CM | POA: Diagnosis not present

## 2016-08-13 DIAGNOSIS — Z79890 Hormone replacement therapy (postmenopausal): Secondary | ICD-10-CM

## 2016-08-13 DIAGNOSIS — Z780 Asymptomatic menopausal state: Secondary | ICD-10-CM

## 2016-08-13 DIAGNOSIS — M810 Age-related osteoporosis without current pathological fracture: Secondary | ICD-10-CM

## 2016-08-13 DIAGNOSIS — Z1329 Encounter for screening for other suspected endocrine disorder: Secondary | ICD-10-CM

## 2016-08-13 DIAGNOSIS — Z9189 Other specified personal risk factors, not elsewhere classified: Secondary | ICD-10-CM | POA: Diagnosis not present

## 2016-08-13 LAB — URINALYSIS W MICROSCOPIC + REFLEX CULTURE
Bilirubin Urine: NEGATIVE
CASTS: NONE SEEN [LPF]
Crystals: NONE SEEN [HPF]
GLUCOSE, UA: NEGATIVE
HGB URINE DIPSTICK: NEGATIVE
Ketones, ur: NEGATIVE
LEUKOCYTES UA: NEGATIVE
Nitrite: NEGATIVE
PROTEIN: NEGATIVE
RBC / HPF: NONE SEEN RBC/HPF (ref <=2)
Specific Gravity, Urine: 1.022 (ref 1.001–1.035)
YEAST: NONE SEEN [HPF]
pH: 6 (ref 5.0–8.0)

## 2016-08-13 LAB — CBC WITH DIFFERENTIAL/PLATELET
BASOS PCT: 0 %
Basophils Absolute: 0 cells/uL (ref 0–200)
EOS PCT: 2 %
Eosinophils Absolute: 120 cells/uL (ref 15–500)
HEMATOCRIT: 40.7 % (ref 35.0–45.0)
Hemoglobin: 14.1 g/dL (ref 11.7–15.5)
LYMPHS PCT: 37 %
Lymphs Abs: 2220 cells/uL (ref 850–3900)
MCH: 34.5 pg — ABNORMAL HIGH (ref 27.0–33.0)
MCHC: 34.6 g/dL (ref 32.0–36.0)
MCV: 99.5 fL (ref 80.0–100.0)
MONO ABS: 480 {cells}/uL (ref 200–950)
MONOS PCT: 8 %
MPV: 9.3 fL (ref 7.5–12.5)
NEUTROS PCT: 53 %
Neutro Abs: 3180 cells/uL (ref 1500–7800)
PLATELETS: 346 10*3/uL (ref 140–400)
RBC: 4.09 MIL/uL (ref 3.80–5.10)
RDW: 12.5 % (ref 11.0–15.0)
WBC: 6 10*3/uL (ref 3.8–10.8)

## 2016-08-13 LAB — TSH: TSH: 3.28 mIU/L

## 2016-08-13 LAB — LIPID PANEL
CHOL/HDL RATIO: 2.8 ratio (ref <=5.0)
CHOLESTEROL: 218 mg/dL — AB (ref 125–200)
HDL: 78 mg/dL (ref >=46)
LDL Cholesterol: 117 mg/dL (ref <130)
TRIGLYCERIDES: 114 mg/dL (ref <150)
VLDL: 23 mg/dL (ref <30)

## 2016-08-13 LAB — COMPREHENSIVE METABOLIC PANEL
ALT: 13 U/L (ref 6–29)
AST: 20 U/L (ref 10–35)
Albumin: 4.1 g/dL (ref 3.6–5.1)
Alkaline Phosphatase: 66 U/L (ref 33–130)
BUN: 18 mg/dL (ref 7–25)
CHLORIDE: 101 mmol/L (ref 98–110)
CO2: 26 mmol/L (ref 20–31)
Calcium: 9.6 mg/dL (ref 8.6–10.4)
Creat: 0.69 mg/dL (ref 0.50–0.99)
Glucose, Bld: 87 mg/dL (ref 65–99)
Potassium: 4.8 mmol/L (ref 3.5–5.3)
SODIUM: 138 mmol/L (ref 135–146)
Total Bilirubin: 0.9 mg/dL (ref 0.2–1.2)
Total Protein: 7.3 g/dL (ref 6.1–8.1)

## 2016-08-13 MED ORDER — ESTROGENS CONJUGATED 0.625 MG PO TABS: 0.6250 mg | ORAL_TABLET | Freq: Every day | ORAL | 4 refills | Status: DC

## 2016-08-13 NOTE — Progress Notes (Signed)
    Wendy GraffRosalie Rangel 1951-01-30 [id number]        65 y.o.  G1P0010  for annual exam.  Doing well from a gynecologic standpoint. Several issues noted below.  Past medical history,surgical history, problem list, medications, allergies, family history and social history were all reviewed and documented as reviewed in the EPIC chart.  ROS:  Performed with pertinent positives and negatives included in the history, assessment and plan.   Additional significant findings :  None   Exam: Kennon PortelaKim Gardner assistant Vitals:   08/13/16 0920  BP: 124/78  Weight: 146 lb (66.2 kg)  Height: 5\' 6"  (1.676 m)   Body mass index is 23.57 kg/m.  General appearance:  Normal affect, orientation and appearance. Skin: Grossly normal HEENT: Without gross lesions.  No cervical or supraclavicular adenopathy. Thyroid normal.  Lungs:  Clear without wheezing, rales or rhonchi Cardiac: RR, without RMG Abdominal:  Soft, nontender, without masses, guarding, rebound, organomegaly or hernia Breasts:  Examined lying and sitting without masses, retractions, discharge or axillary adenopathy. Pelvic:  Ext, BUS, Vagina with atrophic changes. Pap smear done  Adnexa without masses or tenderness    Anus and perineum normal   Rectovaginal normal sphincter tone without palpated masses or tenderness.    Assessment/Plan:  65 y.o. 571P0010 female for annual exam.   1. Composite/HRT. 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 Continues on Premarin 0.625 "when she remembers" I reviewed the issues of HRT and the most current 2017 NAMS guidelines.  Benefits to include symptom relief as well as possible cardiovascular and bone health when started early versus risks to include thrombosis such as stroke heart attack DVT and us cancer issues all reviewed. The issues of starting and stopping also reviewed. Discussed that if she elects to continue she really needs to take it on a consistent basis. After a lengthy discussion the  patient wants to continue and I refilled her 1 year. 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 Pap smear done today. 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 3. Osteoporosis. DEXA 2016 T score -2.7 stable from prior DEXA. History of Boniva for 5 years discontinued 2014. Plan repeat DEXA next year at 2 year interval. Check vitamin D level today. 4. Mammography 06/2016. Continue with annual mammography when due. SBE monthly reviewed. 5. Colonoscopy 2012. Repeat at their recommended interval. 6. Health maintenance. Patient requests baseline labs. CBC, CMP, lipid profile, TSH, vitamin D, urine analysis done. Follow up 1 year, sooner as needed.  Greater than 10 minutes of my time in excess of her gynecologic exam was spent in direct face to face counseling and coordination of care in regards to her hormone replacement therapy, discussion of risks versus benefits as well as review of the most current guidelines.    [name],Tianni Escamilla P MD, 9:49 AM 08/13/2016

## 2016-08-13 NOTE — Patient Instructions (Signed)

## 2016-08-13 NOTE — Addendum Note (Signed)
Addended by: Dayna BarkerGARDNER, KIMBERLY K on: 08/13/2016 10:11 AM   Modules accepted: Orders

## 2016-08-14 LAB — URINE CULTURE: ORGANISM ID, BACTERIA: NO GROWTH

## 2016-08-14 LAB — VITAMIN D 25 HYDROXY (VIT D DEFICIENCY, FRACTURES): Vit D, 25-Hydroxy: 35 ng/mL (ref 30–100)

## 2016-08-17 LAB — PAP IG W/ RFLX HPV ASCU

## 2017-06-04 ENCOUNTER — Other Ambulatory Visit: Payer: Self-pay | Admitting: Gynecology

## 2017-06-04 DIAGNOSIS — Z1231 Encounter for screening mammogram for malignant neoplasm of breast: Secondary | ICD-10-CM

## 2017-06-28 ENCOUNTER — Other Ambulatory Visit: Payer: Self-pay | Admitting: *Deleted

## 2017-06-28 DIAGNOSIS — M818 Other osteoporosis without current pathological fracture: Secondary | ICD-10-CM

## 2017-07-07 ENCOUNTER — Ambulatory Visit
Admission: RE | Admit: 2017-07-07 | Discharge: 2017-07-07 | Disposition: A | Discharge status: Home / Self Care | Payer: BC Managed Care – PPO | Source: Ambulatory Visit | Attending: Gynecology | Admitting: Gynecology

## 2017-07-07 DIAGNOSIS — Z1231 Encounter for screening mammogram for malignant neoplasm of breast: Secondary | ICD-10-CM

## 2017-08-17 ENCOUNTER — Other Ambulatory Visit: Payer: Self-pay | Admitting: Gynecology

## 2017-08-17 ENCOUNTER — Encounter: Payer: Self-pay | Admitting: Gynecology

## 2017-08-17 ENCOUNTER — Ambulatory Visit (INDEPENDENT_AMBULATORY_CARE_PROVIDER_SITE_OTHER): Payer: Medicare Other

## 2017-08-17 ENCOUNTER — Ambulatory Visit: Payer: BC Managed Care – PPO | Admitting: Gynecology

## 2017-08-17 VITALS — BP 122/78 | Ht 66.0 in | Wt 150.0 lb

## 2017-08-17 DIAGNOSIS — Z79890 Hormone replacement therapy (postmenopausal): Secondary | ICD-10-CM

## 2017-08-17 DIAGNOSIS — M810 Age-related osteoporosis without current pathological fracture: Secondary | ICD-10-CM | POA: Diagnosis not present

## 2017-08-17 DIAGNOSIS — Z13820 Encounter for screening for osteoporosis: Secondary | ICD-10-CM

## 2017-08-17 DIAGNOSIS — Z1272 Encounter for screening for malignant neoplasm of vagina: Secondary | ICD-10-CM

## 2017-08-17 DIAGNOSIS — M818 Other osteoporosis without current pathological fracture: Secondary | ICD-10-CM

## 2017-08-17 DIAGNOSIS — Z9189 Other specified personal risk factors, not elsewhere classified: Secondary | ICD-10-CM

## 2017-08-17 DIAGNOSIS — Z13220 Encounter for screening for lipoid disorders: Secondary | ICD-10-CM

## 2017-08-17 DIAGNOSIS — N952 Postmenopausal atrophic vaginitis: Secondary | ICD-10-CM

## 2017-08-17 DIAGNOSIS — Z780 Asymptomatic menopausal state: Secondary | ICD-10-CM | POA: Diagnosis not present

## 2017-08-17 DIAGNOSIS — Z01411 Encounter for gynecological examination (general) (routine) with abnormal findings: Secondary | ICD-10-CM | POA: Diagnosis not present

## 2017-08-17 MED ORDER — ESTROGENS CONJUGATED 0.625 MG PO TABS: 0.6250 mg | ORAL_TABLET | Freq: Every day | ORAL | 4 refills | Status: DC

## 2017-08-17 NOTE — Patient Instructions (Signed)
Follow-up in 1 year for annual exam, sooner as needed. 

## 2017-08-17 NOTE — Addendum Note (Signed)
Addended by: Dayna BarkerGARDNER, Rolonda Pontarelli K on: 08/17/2017 10:21 AM   Modules accepted: Orders

## 2017-08-17 NOTE — Progress Notes (Addendum)
    Wendy GraffRosalie Rangel 07-09-1951 [id number]        66 y.o.  G1P0010 for annual gynecologic exam.  Doing well without complaints.  Patient also had her bone density done today.  Past medical history,surgical history, problem list, medications, allergies, family history and social history were all reviewed and documented as reviewed in the EPIC chart.  ROS:  Performed with pertinent positives and negatives included in the history, assessment and plan.   Additional significant findings : None   Exam: Kennon PortelaKim Gardner assistant Vitals:   08/17/17 0937  BP: 122/78  Weight: 150 lb (68 kg)  Height: 5\' 6"  (1.676 m)   Body mass index is 24.21 kg/m.  General appearance:  Normal affect, orientation and appearance. Skin: Grossly normal HEENT: Without gross lesions.  No cervical or supraclavicular adenopathy. Thyroid normal.  Lungs:  Clear without wheezing, rales or rhonchi Cardiac: RR, without RMG Abdominal:  Soft, nontender, without masses, guarding, rebound, organomegaly or hernia Breasts:  Examined lying and sitting without masses, retractions, discharge or axillary adenopathy. Pelvic:  Ext, BUS, Vagina: With atrophic changes.  Pap smear of cuff done  Adnexa: Without masses or tenderness    Anus and perineum: Normal   Rectovaginal: Normal sphincter tone without palpated masses or tenderness.    Assessment/Plan:  66 y.o. 201P0010 female for annual gynecologic exam.   1. Postmenopausal/atrophic genital changes.  Continues on Premarin 0.625 daily.  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  We again reviewed the issues of ERT to include risks versus benefits.  Increased risk of thrombosis such as stroke heart attack DVT in the breast cancer issue reviewed.  Benefits to include symptom relief as well as possible cardiovascular and bone health also discussed.  Is being followed for osteoporosis.  At this point the patient wants to continue it.  Switching to estradiol versus Premarin was  also reviewed.  Patient not interested in changing.  Refill of Premarin 0.25 x 1 year prescribed. 2. Osteoporosis.  DEXA today overall is stable.  T score -2.6 AP spine.  Was previously -2.4 but not statistically significant.  Maximizing calcium and vitamin D reviewed.  Will plan follow-up DEXA in 2 years.  Had used Boniva for 5 years discontinued in 2014.  Will continue on her drug-free holiday for now. 3. 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  Pap smear done today.  We will continue with annual cytology per current screening guidelines.  History of VAIN 1 07/2004.  Normal Pap smears since. 4. Mammography 06/2017.  Continue with annual mammography when due.  SBE monthly reviewed. 5. Colonoscopy 2012.  Repeat at their recommended interval. 6. Health maintenance.  Patient requests baseline labs.  CBC, CMP, lipid profile, TSH, vitamin D and urinalysis done.  Follow-up in 1 year, sooner as needed.   [name],Djuna Frechette P MD, 10:10 AM 08/17/2017

## 2017-08-18 ENCOUNTER — Telehealth: Payer: Self-pay | Admitting: *Deleted

## 2017-08-18 LAB — COMPREHENSIVE METABOLIC PANEL
AG Ratio: 1.3 (calc) (ref 1.0–2.5)
ALKALINE PHOSPHATASE (APISO): 74 U/L (ref 33–130)
ALT: 18 U/L (ref 6–29)
AST: 22 U/L (ref 10–35)
Albumin: 4 g/dL (ref 3.6–5.1)
BUN: 19 mg/dL (ref 7–25)
CHLORIDE: 102 mmol/L (ref 98–110)
CO2: 28 mmol/L (ref 20–32)
CREATININE: 0.64 mg/dL (ref 0.50–0.99)
Calcium: 9.3 mg/dL (ref 8.6–10.4)
GLOBULIN: 3.2 g/dL (ref 1.9–3.7)
Glucose, Bld: 87 mg/dL (ref 65–99)
Potassium: 4.7 mmol/L (ref 3.5–5.3)
Sodium: 138 mmol/L (ref 135–146)
Total Bilirubin: 0.9 mg/dL (ref 0.2–1.2)
Total Protein: 7.2 g/dL (ref 6.1–8.1)

## 2017-08-18 LAB — CBC WITH DIFFERENTIAL/PLATELET
BASOS ABS: 19 {cells}/uL (ref 0–200)
Basophils Relative: 0.3 %
EOS ABS: 83 {cells}/uL (ref 15–500)
Eosinophils Relative: 1.3 %
HCT: 39.9 % (ref 35.0–45.0)
Hemoglobin: 13.9 g/dL (ref 11.7–15.5)
Lymphs Abs: 2170 cells/uL (ref 850–3900)
MCH: 34.1 pg — AB (ref 27.0–33.0)
MCHC: 34.8 g/dL (ref 32.0–36.0)
MCV: 97.8 fL (ref 80.0–100.0)
MONOS PCT: 8.6 %
MPV: 9.7 fL (ref 7.5–12.5)
NEUTROS PCT: 55.9 %
Neutro Abs: 3578 cells/uL (ref 1500–7800)
PLATELETS: 329 10*3/uL (ref 140–400)
RBC: 4.08 10*6/uL (ref 3.80–5.10)
RDW: 11.8 % (ref 11.0–15.0)
TOTAL LYMPHOCYTE: 33.9 %
WBC: 6.4 10*3/uL (ref 3.8–10.8)
WBCMIX: 550 {cells}/uL (ref 200–950)

## 2017-08-18 LAB — URINALYSIS W MICROSCOPIC + REFLEX CULTURE
BILIRUBIN URINE: NEGATIVE
Bacteria, UA: NONE SEEN /HPF
Glucose, UA: NEGATIVE
HGB URINE DIPSTICK: NEGATIVE
Hyaline Cast: NONE SEEN /LPF
KETONES UR: NEGATIVE
LEUKOCYTE ESTERASE: NEGATIVE
NITRITES URINE, INITIAL: NEGATIVE
PROTEIN: NEGATIVE
RBC / HPF: NONE SEEN /HPF (ref 0–2)
SQUAMOUS EPITHELIAL / LPF: NONE SEEN /HPF (ref <=5)
Specific Gravity, Urine: 1.021 (ref 1.001–1.03)
WBC, UA: NONE SEEN /HPF (ref 0–5)
pH: 7.5 (ref 5.0–8.0)

## 2017-08-18 LAB — LIPID PANEL
CHOL/HDL RATIO: 2.8 (calc) (ref <5.0)
CHOLESTEROL: 218 mg/dL — AB (ref <200)
HDL: 79 mg/dL (ref >50)
LDL Cholesterol (Calc): 119 mg/dL (calc) — ABNORMAL HIGH
Non-HDL Cholesterol (Calc): 139 mg/dL (calc) — ABNORMAL HIGH (ref <130)
Triglycerides: 94 mg/dL (ref <150)

## 2017-08-18 LAB — NO CULTURE INDICATED

## 2017-08-18 LAB — TSH: TSH: 3.05 m[IU]/L (ref 0.40–4.50)

## 2017-08-18 LAB — VITAMIN D 25 HYDROXY (VIT D DEFICIENCY, FRACTURES): Vit D, 25-Hydroxy: 33 ng/mL (ref 30–100)

## 2017-08-18 NOTE — Telephone Encounter (Signed)
Check with the patient.  Tell her they denied her Premarin.  Options would be #1 pay for Premarin herself #2 switch to estradiol 0.5 mg daily or #3 try the Femring which is the ring that goes intravaginal for 3 months and she absorbs it through the vagina.  This is not obviously an equivalent to the pills but is what the insurance company had recommended.  My recommendation would be to try the estradiol 0.5 mg daily.

## 2017-08-18 NOTE — Telephone Encounter (Signed)
Prior Authorization done for Premarin 0.625 mg tablets, medication denied by [employer].  States premarin is not a covered drug, states pt must try/fail  femring before medication can be approved. Or medical documentation on why femring would not be option for patient. Please advise

## 2017-08-19 LAB — PAP IG W/ RFLX HPV ASCU

## 2017-08-19 MED ORDER — ESTRADIOL 0.5 MG PO TABS: 0.5000 mg | ORAL_TABLET | Freq: Every day | ORAL | 3 refills | Status: DC

## 2017-08-19 NOTE — Telephone Encounter (Signed)
Unable to leave a voicemail, pt mailbox is full.

## 2017-08-19 NOTE — Telephone Encounter (Signed)
Spoke with patient and she would like to try estradiol 0.5 mg tablet. Rx sent.

## 2018-02-08 ENCOUNTER — Telehealth: Payer: Self-pay | Admitting: *Deleted

## 2018-02-08 NOTE — Telephone Encounter (Signed)
Patient called and left message c/o hot flashes, Rx for estradiol 0.5 mg tablet was sent to pharmacy in Nov 2018, pt said she never picked up Rx, because she was trying to see if she could stay off HRT. I called pt back and left on her voicemail that same Rx is at the pharmacy and she should be able to call and get Rx filled to start.

## 2018-05-27 ENCOUNTER — Other Ambulatory Visit: Payer: Self-pay | Admitting: Gynecology

## 2018-05-27 DIAGNOSIS — Z1231 Encounter for screening mammogram for malignant neoplasm of breast: Secondary | ICD-10-CM

## 2018-07-11 ENCOUNTER — Ambulatory Visit: Payer: BC Managed Care – PPO

## 2018-07-27 ENCOUNTER — Ambulatory Visit
Admission: RE | Admit: 2018-07-27 | Discharge: 2018-07-27 | Disposition: A | Discharge status: Home / Self Care | Payer: Medicare Other | Source: Ambulatory Visit | Attending: Gynecology | Admitting: Gynecology

## 2018-07-27 DIAGNOSIS — Z1231 Encounter for screening mammogram for malignant neoplasm of breast: Secondary | ICD-10-CM

## 2018-08-18 ENCOUNTER — Encounter: Payer: Medicare Other | Admitting: Gynecology

## 2018-08-24 ENCOUNTER — Telehealth: Payer: Self-pay | Admitting: *Deleted

## 2018-08-24 ENCOUNTER — Ambulatory Visit: Payer: Medicare Other | Admitting: Gynecology

## 2018-08-24 ENCOUNTER — Encounter: Payer: Self-pay | Admitting: Gynecology

## 2018-08-24 VITALS — BP 122/80 | Ht 66.0 in | Wt 146.0 lb

## 2018-08-24 DIAGNOSIS — Z79890 Hormone replacement therapy (postmenopausal): Secondary | ICD-10-CM

## 2018-08-24 DIAGNOSIS — M810 Age-related osteoporosis without current pathological fracture: Secondary | ICD-10-CM | POA: Diagnosis not present

## 2018-08-24 DIAGNOSIS — Z01419 Encounter for gynecological examination (general) (routine) without abnormal findings: Secondary | ICD-10-CM

## 2018-08-24 DIAGNOSIS — N952 Postmenopausal atrophic vaginitis: Secondary | ICD-10-CM

## 2018-08-24 DIAGNOSIS — Z13220 Encounter for screening for lipoid disorders: Secondary | ICD-10-CM

## 2018-08-24 DIAGNOSIS — Z9189 Other specified personal risk factors, not elsewhere classified: Secondary | ICD-10-CM

## 2018-08-24 MED ORDER — ESTRADIOL 0.5 MG PO TABS: 0.5000 mg | ORAL_TABLET | Freq: Every day | ORAL | 4 refills | Status: DC

## 2018-08-24 NOTE — Patient Instructions (Signed)
Follow-up in 1 year for annual exam, sooner as needed. 

## 2018-08-24 NOTE — Progress Notes (Signed)
    Jaymes GraffRosalie Ryner [birth year] [id number]        67 y.o.  G1P0010 for annual gynecologic exam.  Doing well without complaints.  Past medical history,surgical history, problem list, medications, allergies, family history and social history were all reviewed and documented as reviewed in the EPIC chart.  ROS:  Performed with pertinent positives and negatives included in the history, assessment and plan.   Additional significant findings : None   Exam: Kennon PortelaKim Gardner assistant Vitals:   08/24/18 0756  BP: 122/80  Weight: 146 lb (66.2 kg)  Height: 5\' 6"  (1.676 m)   Body mass index is 23.57 kg/m.  General appearance:  Normal affect, orientation and appearance. Skin: Grossly normal HEENT: Without gross lesions.  No cervical or supraclavicular adenopathy. Thyroid normal.  Lungs:  Clear without wheezing, rales or rhonchi Cardiac: RR, without RMG Abdominal:  Soft, nontender, without masses, guarding, rebound, organomegaly or hernia Breasts:  Examined lying and sitting without masses, retractions, discharge or axillary adenopathy. Pelvic:  Ext, BUS, Vagina: With atrophic changes.  Pap smear of vaginal cuff done  Adnexa: Without masses or tenderness    Anus and perineum: Normal   Rectovaginal: Normal sphincter tone without palpated masses or tenderness.    Assessment/Plan:  67 y.o. 411P0010 female for annual gynecologic exam.  Status post TAH/BSO with appendectomy 2004 for leiomyoma.  1. Postmenopausal/ERT.  Continues on estradiol 0.5 mg daily.  Had been on Premarin but due to insurance changes stopped but had unacceptable hot flushes and sweats and was reinitiated on estradiol.  We discussed the risks versus benefits to include increased risk of thrombosis such as stroke heart attack DVT in the breast cancer issue.  Benefits to include symptom relief cardiovascular and bone health noting she has osteoporosis discussed.  At this point patient wants to continue and I refilled her x1  year. 2. Osteoporosis.  DEXA 2018.  Stable from prior DEXA at T score -2.6.  Boniva for approximately 5 years stopped 2014.  Check vitamin D level today and TSH.  Plan repeat DEXA next year. 3. Mammography 07/2018.  Continue with annual mammography when due.  Breast exam normal today. 4. 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  Pap smear of vaginal cuff today.  History of VAIN 2005.  Normal Pap smears since. 5. Colonoscopy 2012.  Repeat at their recommended interval. 6. Health maintenance.  Requests baseline labs.  CBC, CMP, lipid profile, TSH and vitamin D ordered.  Follow-up 1 year, sooner as needed.   Dara Lordsimothy P Alysha Doolan MD, 8:24 AM 08/24/2018

## 2018-08-24 NOTE — Telephone Encounter (Signed)
Prior authorization done via cover my meds for estradiol 0.5 mg tablet, will wait for response.  

## 2018-08-24 NOTE — Telephone Encounter (Signed)
Medication approved through 10/12/2019.

## 2018-08-24 NOTE — Addendum Note (Signed)
Addended by: Dayna BarkerGARDNER, Carlina Derks K on: 08/24/2018 09:12 AM   Modules accepted: Orders

## 2018-08-25 ENCOUNTER — Other Ambulatory Visit: Payer: Self-pay | Admitting: Gynecology

## 2018-08-25 DIAGNOSIS — R7989 Other specified abnormal findings of blood chemistry: Secondary | ICD-10-CM

## 2018-08-25 LAB — LIPID PANEL
CHOL/HDL RATIO: 3 (calc) (ref <5.0)
CHOLESTEROL: 220 mg/dL — AB (ref <200)
HDL: 73 mg/dL (ref >50)
LDL Cholesterol (Calc): 128 mg/dL (calc) — ABNORMAL HIGH
NON-HDL CHOLESTEROL (CALC): 147 mg/dL — AB (ref <130)
Triglycerides: 89 mg/dL (ref <150)

## 2018-08-25 LAB — CBC WITH DIFFERENTIAL/PLATELET
BASOS ABS: 32 {cells}/uL (ref 0–200)
Basophils Relative: 0.5 %
EOS PCT: 1.6 %
Eosinophils Absolute: 102 cells/uL (ref 15–500)
HCT: 40.1 % (ref 35.0–45.0)
Hemoglobin: 14.4 g/dL (ref 11.7–15.5)
Lymphs Abs: 2285 cells/uL (ref 850–3900)
MCH: 34.6 pg — ABNORMAL HIGH (ref 27.0–33.0)
MCHC: 35.9 g/dL (ref 32.0–36.0)
MCV: 96.4 fL (ref 80.0–100.0)
MONOS PCT: 8.7 %
MPV: 9.7 fL (ref 7.5–12.5)
Neutro Abs: 3424 cells/uL (ref 1500–7800)
Neutrophils Relative %: 53.5 %
PLATELETS: 336 10*3/uL (ref 140–400)
RBC: 4.16 10*6/uL (ref 3.80–5.10)
RDW: 11.7 % (ref 11.0–15.0)
TOTAL LYMPHOCYTE: 35.7 %
WBC mixed population: 557 cells/uL (ref 200–950)
WBC: 6.4 10*3/uL (ref 3.8–10.8)

## 2018-08-25 LAB — COMPREHENSIVE METABOLIC PANEL
AG Ratio: 1.4 (calc) (ref 1.0–2.5)
ALT: 15 U/L (ref 6–29)
AST: 20 U/L (ref 10–35)
Albumin: 4.1 g/dL (ref 3.6–5.1)
Alkaline phosphatase (APISO): 70 U/L (ref 33–130)
BUN: 13 mg/dL (ref 7–25)
CO2: 29 mmol/L (ref 20–32)
CREATININE: 0.63 mg/dL (ref 0.50–0.99)
Calcium: 9.3 mg/dL (ref 8.6–10.4)
Chloride: 105 mmol/L (ref 98–110)
GLUCOSE: 92 mg/dL (ref 65–99)
Globulin: 3 g/dL (calc) (ref 1.9–3.7)
Potassium: 4.3 mmol/L (ref 3.5–5.3)
Sodium: 140 mmol/L (ref 135–146)
Total Bilirubin: 0.6 mg/dL (ref 0.2–1.2)
Total Protein: 7.1 g/dL (ref 6.1–8.1)

## 2018-08-25 LAB — TSH: TSH: 4.44 mIU/L (ref 0.40–4.50)

## 2018-08-25 LAB — VITAMIN D 25 HYDROXY (VIT D DEFICIENCY, FRACTURES): Vit D, 25-Hydroxy: 30 ng/mL (ref 30–100)

## 2018-08-26 LAB — PAP IG W/ RFLX HPV ASCU

## 2019-02-20 ENCOUNTER — Other Ambulatory Visit: Payer: Medicare Other

## 2019-02-22 ENCOUNTER — Other Ambulatory Visit: Payer: Medicare Other

## 2019-02-22 ENCOUNTER — Other Ambulatory Visit: Payer: Self-pay

## 2019-02-22 DIAGNOSIS — R7989 Other specified abnormal findings of blood chemistry: Secondary | ICD-10-CM

## 2019-02-23 ENCOUNTER — Encounter: Payer: Self-pay | Admitting: Gynecology

## 2019-02-23 LAB — TSH: TSH: 4.11 mIU/L (ref 0.40–4.50)

## 2019-06-13 ENCOUNTER — Other Ambulatory Visit: Payer: Self-pay | Admitting: Gynecology

## 2019-06-13 DIAGNOSIS — Z1231 Encounter for screening mammogram for malignant neoplasm of breast: Secondary | ICD-10-CM

## 2019-07-19 ENCOUNTER — Encounter: Payer: Self-pay | Admitting: Gynecology

## 2019-07-31 ENCOUNTER — Other Ambulatory Visit: Payer: Self-pay

## 2019-07-31 ENCOUNTER — Ambulatory Visit
Admission: RE | Admit: 2019-07-31 | Discharge: 2019-07-31 | Disposition: A | Discharge status: Home / Self Care | Payer: Medicare Other | Source: Ambulatory Visit | Attending: Gynecology | Admitting: Gynecology

## 2019-07-31 DIAGNOSIS — Z1231 Encounter for screening mammogram for malignant neoplasm of breast: Secondary | ICD-10-CM

## 2019-08-28 ENCOUNTER — Encounter: Payer: Medicare Other | Admitting: Gynecology

## 2019-08-28 ENCOUNTER — Other Ambulatory Visit: Payer: Self-pay

## 2019-08-29 ENCOUNTER — Encounter (INDEPENDENT_AMBULATORY_CARE_PROVIDER_SITE_OTHER): Payer: Medicare Other

## 2019-08-29 ENCOUNTER — Ambulatory Visit (INDEPENDENT_AMBULATORY_CARE_PROVIDER_SITE_OTHER): Payer: Medicare Other | Admitting: Gynecology

## 2019-08-29 ENCOUNTER — Encounter: Payer: Self-pay | Admitting: Gynecology

## 2019-08-29 VITALS — BP 124/80 | Ht 65.0 in | Wt 146.0 lb

## 2019-08-29 DIAGNOSIS — Z79890 Hormone replacement therapy (postmenopausal): Secondary | ICD-10-CM

## 2019-08-29 DIAGNOSIS — M810 Age-related osteoporosis without current pathological fracture: Secondary | ICD-10-CM

## 2019-08-29 DIAGNOSIS — N952 Postmenopausal atrophic vaginitis: Secondary | ICD-10-CM

## 2019-08-29 DIAGNOSIS — Z9189 Other specified personal risk factors, not elsewhere classified: Secondary | ICD-10-CM

## 2019-08-29 DIAGNOSIS — Z13220 Encounter for screening for lipoid disorders: Secondary | ICD-10-CM

## 2019-08-29 DIAGNOSIS — Z780 Asymptomatic menopausal state: Secondary | ICD-10-CM | POA: Diagnosis not present

## 2019-08-29 DIAGNOSIS — Z01419 Encounter for gynecological examination (general) (routine) without abnormal findings: Secondary | ICD-10-CM | POA: Diagnosis not present

## 2019-08-29 MED ORDER — ESTRADIOL 0.5 MG PO TABS: 0.5000 mg | ORAL_TABLET | Freq: Every day | ORAL | 4 refills | Status: DC

## 2019-08-29 NOTE — Patient Instructions (Signed)
Follow-up in 1 year for annual exam, sooner if any issues. 

## 2019-08-29 NOTE — Progress Notes (Signed)
    Wendy Rangel 11/19/1950 [id number]        68 y.o.  G1P0010 for annual gynecologic exam.  Without gynecologic complaints  Past medical history,surgical history, problem list, medications, allergies, family history and social history were all reviewed and documented as reviewed in the EPIC chart.  ROS:  Performed with pertinent positives and negatives included in the history, assessment and plan.   Additional significant findings : None   Exam: Caryn Bee assistant Vitals:   08/29/19 1444  BP: 124/80  Weight: 146 lb (66.2 kg)  Height: 5\' 5"  (1.651 m)   Body mass index is 24.3 kg/m.  General appearance:  Normal affect, orientation and appearance. Skin: Grossly normal HEENT: Without gross lesions.  No cervical or supraclavicular adenopathy. Thyroid normal.  Lungs:  Clear without wheezing, rales or rhonchi Cardiac: RR, without RMG Abdominal:  Soft, nontender, without masses, guarding, rebound, organomegaly or hernia Breasts:  Examined lying and sitting without masses, retractions, discharge or axillary adenopathy. Pelvic:  Ext, BUS, Vagina: With atrophic changes.  Pap smear done  Adnexa: Without masses or tenderness    Anus and perineum: Normal   Rectovaginal: Normal sphincter tone without palpated masses or tenderness.    Assessment/Plan:  68 y.o. G56P0010 female for annual gynecologic exam.  Status post TAH/BSO with appendectomy 2004 for leiomyoma.  1. Postmenopausal/ERT.  Continues on estradiol 0.5 mg daily.  Had tried stopping but had unacceptable side effects.  We again discussed risks versus benefits to include the risk of thrombosis such as stroke heart attack DVT and the breast cancer issue.  At this point the patient prefers to continue and I refilled her x1 year. 2. Osteoporosis.  DEXA today T score -2.6 stable from prior DEXA.  History of Boniva for 5 years stopped 2014.  Check TSH and vitamin D today.  Continue on HRT.  Repeat DEXA at 2-year interval. 3.  Mammography 07/2019.  Continue with annual mammography when due.  Breast exam normal today. 4. Colonoscopy 2012.  Has colonoscopy scheduled next week. 5. DES exposure in utero.  Pap smear done today.  We will continue with annual Pap smears.  History of VAIN 2005 with normal Pap smears since. 6. Health maintenance.  Requests baseline labs.  CBC, CMP, lipid profile, TSH and vitamin D ordered.  Follow-up 1 year, sooner as needed.   Anastasio Auerbach MD, 3:19 PM 08/29/2019

## 2019-08-29 NOTE — Addendum Note (Signed)
Addended by: Nelva Nay on: 08/29/2019 04:04 PM   Modules accepted: Orders

## 2019-08-30 ENCOUNTER — Encounter: Payer: Self-pay | Admitting: Gynecology

## 2019-08-30 LAB — CBC WITH DIFFERENTIAL/PLATELET
Absolute Monocytes: 655 cells/uL (ref 200–950)
Basophils Absolute: 31 cells/uL (ref 0–200)
Basophils Relative: 0.4 %
Eosinophils Absolute: 100 cells/uL (ref 15–500)
Eosinophils Relative: 1.3 %
HCT: 40.8 % (ref 35.0–45.0)
Hemoglobin: 14.1 g/dL (ref 11.7–15.5)
Lymphs Abs: 2687 cells/uL (ref 850–3900)
MCH: 34.1 pg — ABNORMAL HIGH (ref 27.0–33.0)
MCHC: 34.6 g/dL (ref 32.0–36.0)
MCV: 98.6 fL (ref 80.0–100.0)
MPV: 9.7 fL (ref 7.5–12.5)
Monocytes Relative: 8.5 %
Neutro Abs: 4227 cells/uL (ref 1500–7800)
Neutrophils Relative %: 54.9 %
Platelets: 351 10*3/uL (ref 140–400)
RBC: 4.14 10*6/uL (ref 3.80–5.10)
RDW: 11.8 % (ref 11.0–15.0)
Total Lymphocyte: 34.9 %
WBC: 7.7 10*3/uL (ref 3.8–10.8)

## 2019-08-30 LAB — TSH: TSH: 2.93 mIU/L (ref 0.40–4.50)

## 2019-08-30 LAB — COMPREHENSIVE METABOLIC PANEL
AG Ratio: 1.4 (calc) (ref 1.0–2.5)
ALT: 18 U/L (ref 6–29)
AST: 23 U/L (ref 10–35)
Albumin: 4.2 g/dL (ref 3.6–5.1)
Alkaline phosphatase (APISO): 64 U/L (ref 37–153)
BUN: 17 mg/dL (ref 7–25)
CO2: 29 mmol/L (ref 20–32)
Calcium: 9.3 mg/dL (ref 8.6–10.4)
Chloride: 102 mmol/L (ref 98–110)
Creat: 0.63 mg/dL (ref 0.50–0.99)
Globulin: 3 g/dL (calc) (ref 1.9–3.7)
Glucose, Bld: 88 mg/dL (ref 65–99)
Potassium: 4.3 mmol/L (ref 3.5–5.3)
Sodium: 138 mmol/L (ref 135–146)
Total Bilirubin: 0.9 mg/dL (ref 0.2–1.2)
Total Protein: 7.2 g/dL (ref 6.1–8.1)

## 2019-08-30 LAB — LIPID PANEL
Cholesterol: 223 mg/dL — ABNORMAL HIGH (ref <200)
HDL: 78 mg/dL (ref >=50)
LDL Cholesterol (Calc): 126 mg/dL (calc) — ABNORMAL HIGH
Non-HDL Cholesterol (Calc): 145 mg/dL (calc) — ABNORMAL HIGH (ref <130)
Total CHOL/HDL Ratio: 2.9 (calc) (ref <5.0)
Triglycerides: 91 mg/dL (ref <150)

## 2019-08-30 LAB — VITAMIN D 25 HYDROXY (VIT D DEFICIENCY, FRACTURES): Vit D, 25-Hydroxy: 39 ng/mL (ref 30–100)

## 2019-08-31 LAB — PAP IG W/ RFLX HPV ASCU

## 2019-10-11 ENCOUNTER — Encounter: Payer: Self-pay | Admitting: *Deleted

## 2020-04-22 ENCOUNTER — Other Ambulatory Visit: Payer: Self-pay

## 2020-04-22 MED ORDER — ESTRADIOL 0.5 MG PO TABS: 0.5000 mg | ORAL_TABLET | Freq: Every day | ORAL | 1 refills | Status: AC

## 2020-08-29 ENCOUNTER — Encounter: Payer: Medicare Other | Admitting: Obstetrics and Gynecology

## 2020-08-29 ENCOUNTER — Encounter: Payer: Medicare Other | Admitting: Gynecology

## 2020-12-02 IMAGING — MG DIGITAL SCREENING BILAT W/ TOMO W/ CAD
8 series · 8 of 24 positions shown · non-contrast
Comparison: Previous exam(s).

CLINICAL DATA: Screening.

EXAM:
DIGITAL SCREENING BILATERAL MAMMOGRAM WITH TOMO AND CAD

[L CC synth-2D]
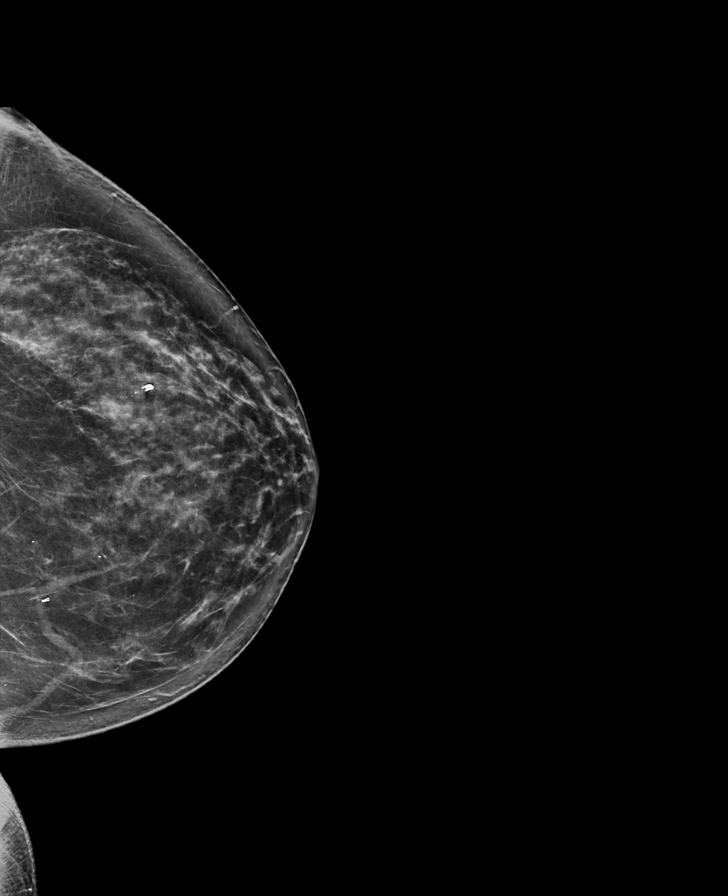

[R MLO synth-2D]
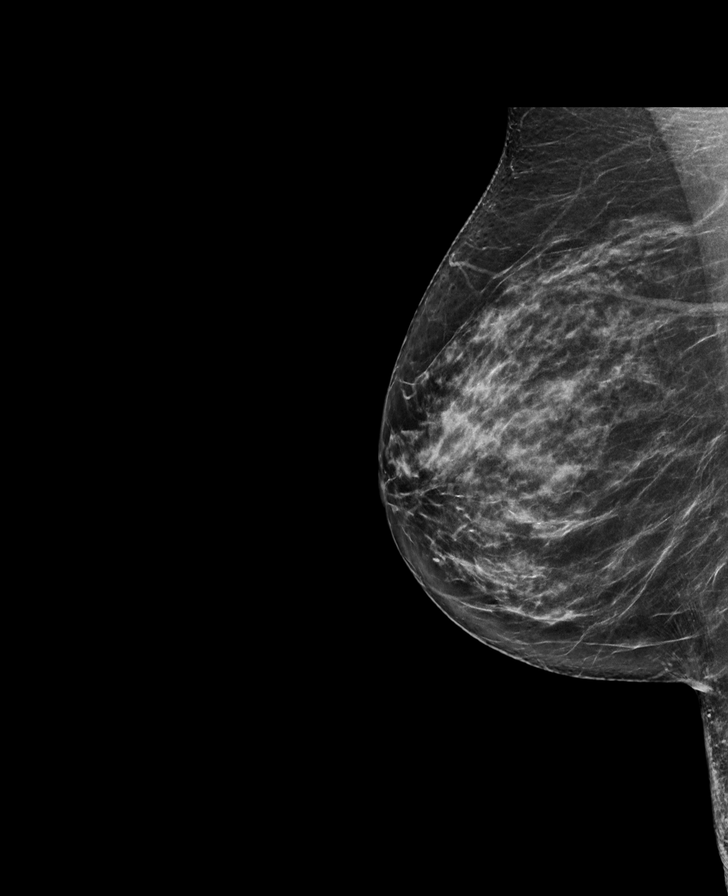

[L MLO synth-2D]
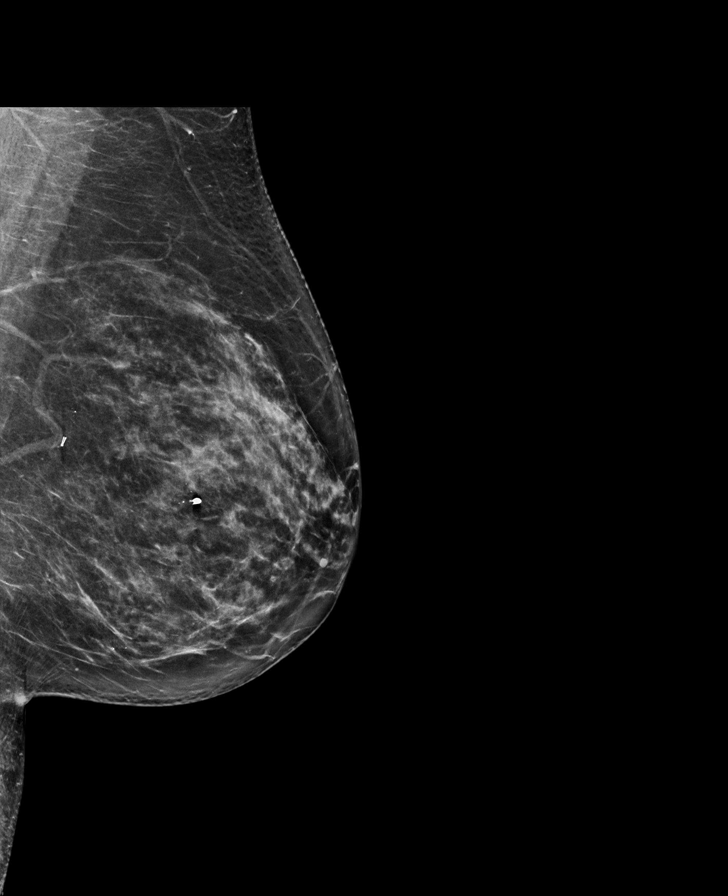

[R CC synth-2D]
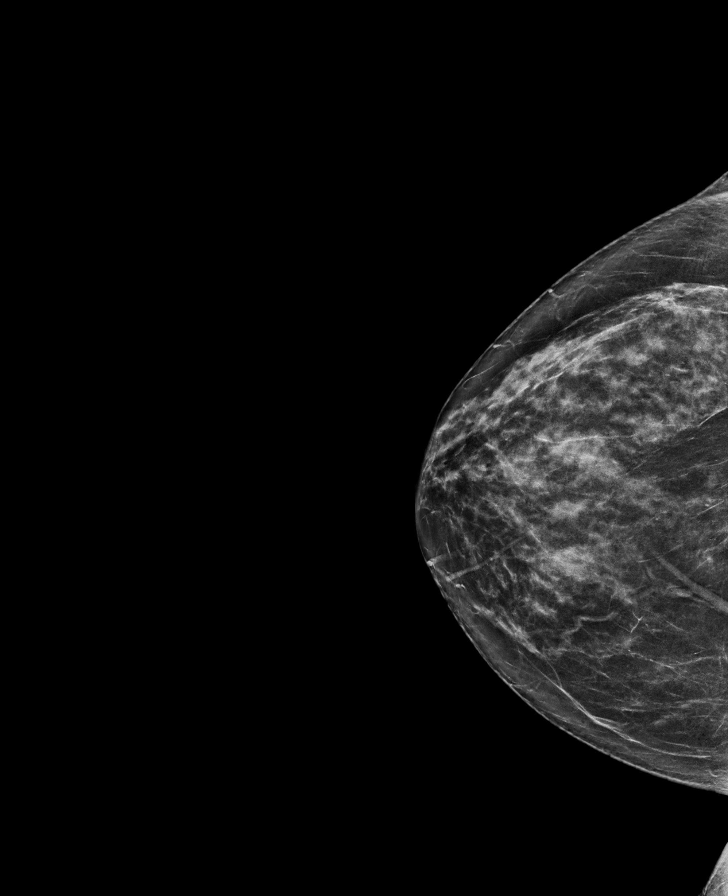

[R MLO tomo · tomo slice 36/71.0]
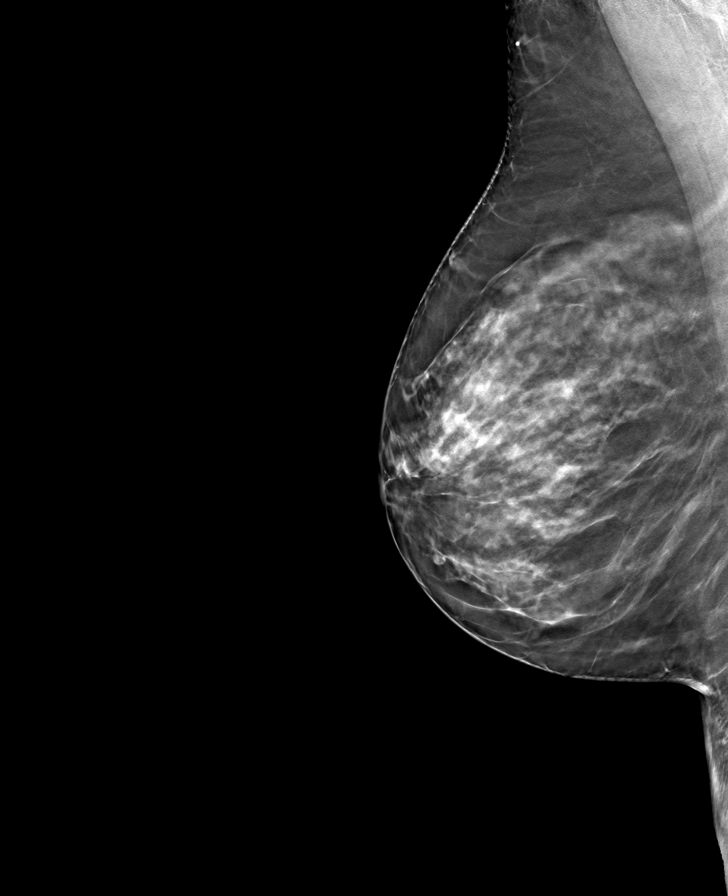

[L MLO tomo · tomo slice 40/79.0]
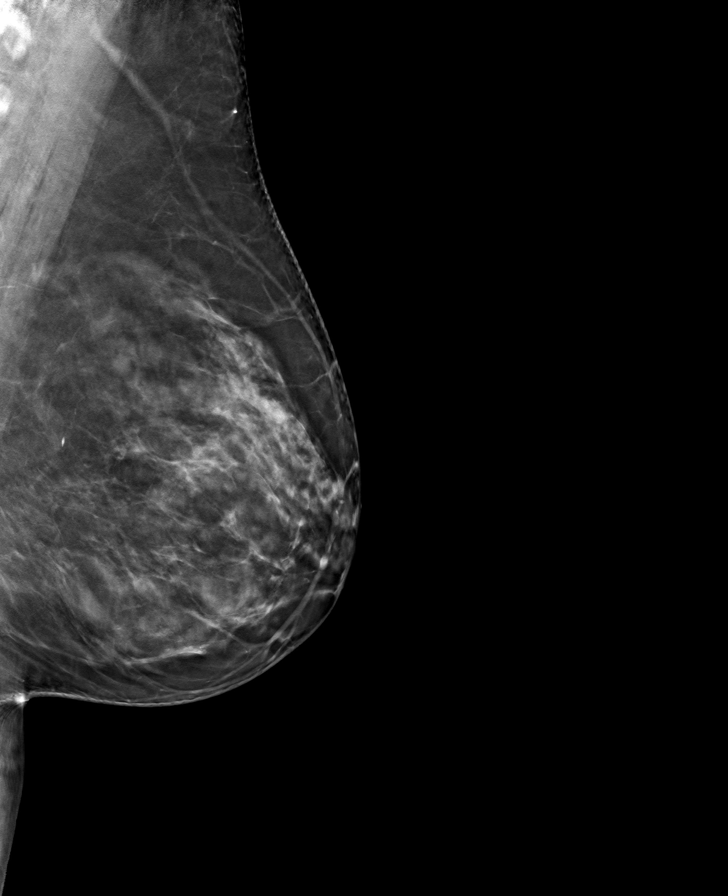

[L CC tomo · tomo slice 40/79.0]
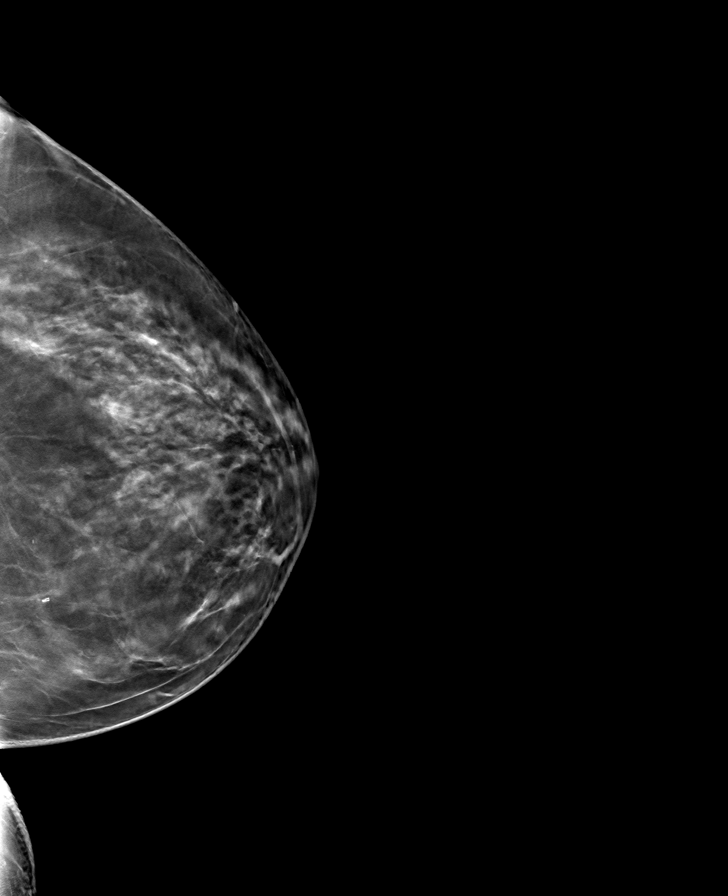

[R CC tomo · tomo slice 37/72.0]
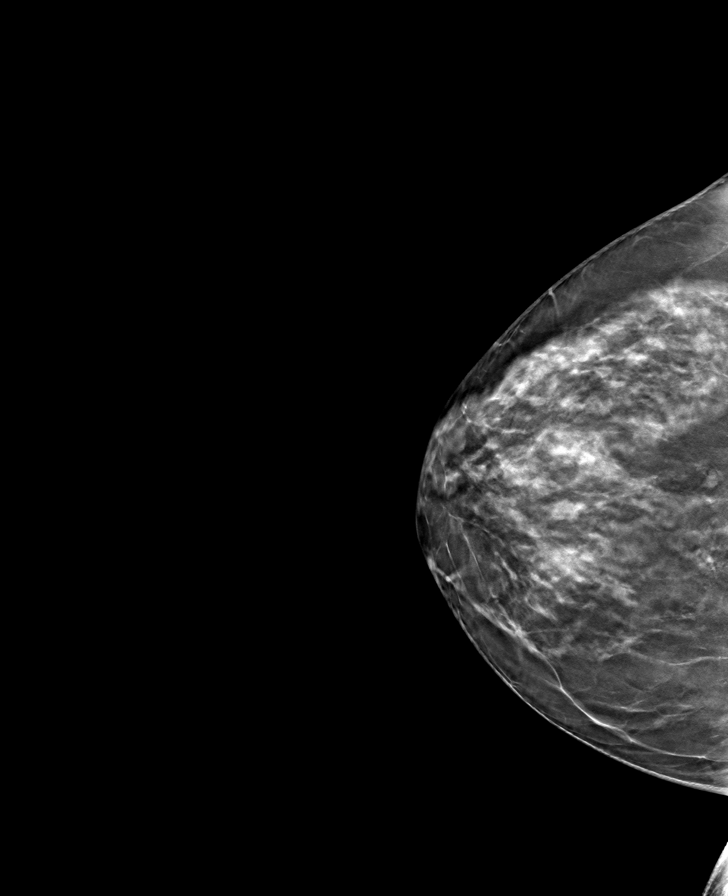

[8 of 24 positions shown; findings below may reference images not displayed]

ACR Breast Density Category c: The breast tissue is heterogeneously
dense, which may obscure small masses.
FINDINGS: There are no findings suspicious for malignancy. Images were
processed with CAD.
IMPRESSION: No mammographic evidence of malignancy. A result letter of this
screening mammogram will be mailed directly to the patient.

RECOMMENDATION:
Screening mammogram in one year. (Code:[id number]-U-LHB)

BI-RADS CATEGORY  1: Negative.
